# Patient Record
Sex: Female | Born: 1965 | Race: White | Hispanic: No | Marital: Married | Smoking: Never smoker
Health system: Southern US, Community
[De-identification: ages and names within clinical notes are randomized; demographics above are authoritative.]

## PROBLEM LIST (undated history)

## (undated) DIAGNOSIS — N912 Amenorrhea, unspecified: Secondary | ICD-10-CM

## (undated) DIAGNOSIS — N970 Female infertility associated with anovulation: Secondary | ICD-10-CM

## (undated) DIAGNOSIS — N6320 Unspecified lump in the left breast, unspecified quadrant: Secondary | ICD-10-CM

## (undated) DIAGNOSIS — O9989 Other specified diseases and conditions complicating pregnancy, childbirth and the puerperium: Secondary | ICD-10-CM

## (undated) DIAGNOSIS — Z8742 Personal history of other diseases of the female genital tract: Secondary | ICD-10-CM

## (undated) DIAGNOSIS — IMO0002 Reserved for concepts with insufficient information to code with codable children: Secondary | ICD-10-CM

## (undated) DIAGNOSIS — A4902 Methicillin resistant Staphylococcus aureus infection, unspecified site: Secondary | ICD-10-CM

## (undated) DIAGNOSIS — N63 Unspecified lump in unspecified breast: Secondary | ICD-10-CM

## (undated) DIAGNOSIS — Z9889 Other specified postprocedural states: Secondary | ICD-10-CM

## (undated) DIAGNOSIS — Z8619 Personal history of other infectious and parasitic diseases: Secondary | ICD-10-CM

## (undated) HISTORY — DX: Amenorrhea, unspecified: N91.2

## (undated) HISTORY — DX: Other specified postprocedural states: Z98.890

## (undated) HISTORY — DX: Personal history of other diseases of the female genital tract: Z87.42

## (undated) HISTORY — DX: Personal history of other infectious and parasitic diseases: Z86.19

## (undated) HISTORY — DX: Reserved for concepts with insufficient information to code with codable children: IMO0002

## (undated) HISTORY — PX: TUBAL LIGATION: SHX77

## (undated) HISTORY — PX: DILATION AND CURETTAGE OF UTERUS: SHX78

## (undated) HISTORY — DX: Other specified diseases and conditions complicating pregnancy, childbirth and the puerperium: O99.89

## (undated) HISTORY — DX: Female infertility associated with anovulation: N97.0

## (undated) HISTORY — PX: LAPAROSCOPY: SHX197

## (undated) HISTORY — DX: Unspecified lump in unspecified breast: N63.0

---

## 1995-10-26 DIAGNOSIS — IMO0002 Reserved for concepts with insufficient information to code with codable children: Secondary | ICD-10-CM

## 1995-10-26 DIAGNOSIS — R87619 Unspecified abnormal cytological findings in specimens from cervix uteri: Secondary | ICD-10-CM

## 1995-10-26 HISTORY — DX: Reserved for concepts with insufficient information to code with codable children: IMO0002

## 1995-10-26 HISTORY — DX: Unspecified abnormal cytological findings in specimens from cervix uteri: R87.619

## 1998-02-06 ENCOUNTER — Other Ambulatory Visit: Admission: RE | Admit: 1998-02-06 | Discharge: 1998-02-06 | Payer: Self-pay | Admitting: Family Medicine

## 2000-01-12 ENCOUNTER — Other Ambulatory Visit: Admission: RE | Admit: 2000-01-12 | Discharge: 2000-01-12 | Payer: Self-pay | Admitting: Family Medicine

## 2001-04-30 ENCOUNTER — Other Ambulatory Visit: Admission: RE | Admit: 2001-04-30 | Discharge: 2001-04-30 | Payer: Self-pay | Admitting: Family Medicine

## 2001-08-11 ENCOUNTER — Ambulatory Visit (HOSPITAL_COMMUNITY): Admission: RE | Admit: 2001-08-11 | Discharge: 2001-08-11 | Payer: Self-pay | Admitting: Obstetrics and Gynecology

## 2001-08-11 ENCOUNTER — Encounter: Payer: Self-pay | Admitting: Obstetrics and Gynecology

## 2002-04-13 ENCOUNTER — Ambulatory Visit (HOSPITAL_COMMUNITY): Admission: RE | Admit: 2002-04-13 | Discharge: 2002-04-13 | Payer: Self-pay | Admitting: Obstetrics and Gynecology

## 2002-05-03 ENCOUNTER — Other Ambulatory Visit: Admission: RE | Admit: 2002-05-03 | Discharge: 2002-05-03 | Payer: Self-pay | Admitting: Obstetrics and Gynecology

## 2002-05-07 ENCOUNTER — Encounter: Payer: Self-pay | Admitting: Obstetrics and Gynecology

## 2002-05-07 ENCOUNTER — Encounter: Admission: RE | Admit: 2002-05-07 | Discharge: 2002-05-07 | Payer: Self-pay | Admitting: Obstetrics and Gynecology

## 2003-06-12 ENCOUNTER — Other Ambulatory Visit: Admission: RE | Admit: 2003-06-12 | Discharge: 2003-06-12 | Payer: Self-pay | Admitting: Obstetrics and Gynecology

## 2003-08-28 ENCOUNTER — Ambulatory Visit (HOSPITAL_COMMUNITY): Admission: RE | Admit: 2003-08-28 | Discharge: 2003-08-28 | Payer: Self-pay | Admitting: Obstetrics and Gynecology

## 2003-12-11 ENCOUNTER — Inpatient Hospital Stay (HOSPITAL_COMMUNITY): Admission: AD | Admit: 2003-12-11 | Discharge: 2003-12-11 | Payer: Self-pay | Admitting: Obstetrics and Gynecology

## 2004-01-06 ENCOUNTER — Inpatient Hospital Stay (HOSPITAL_COMMUNITY): Admission: AD | Admit: 2004-01-06 | Discharge: 2004-01-06 | Payer: Self-pay | Admitting: Obstetrics and Gynecology

## 2004-01-12 ENCOUNTER — Inpatient Hospital Stay (HOSPITAL_COMMUNITY): Admission: AD | Admit: 2004-01-12 | Discharge: 2004-01-18 | Payer: Self-pay | Admitting: Obstetrics and Gynecology

## 2004-01-13 ENCOUNTER — Encounter (INDEPENDENT_AMBULATORY_CARE_PROVIDER_SITE_OTHER): Payer: Self-pay | Admitting: Specialist

## 2004-01-14 DIAGNOSIS — O99891 Other specified diseases and conditions complicating pregnancy: Secondary | ICD-10-CM

## 2004-01-14 DIAGNOSIS — Z9889 Other specified postprocedural states: Secondary | ICD-10-CM

## 2004-01-14 HISTORY — DX: Other specified diseases and conditions complicating pregnancy: O99.891

## 2004-01-14 HISTORY — DX: Other specified postprocedural states: Z98.890

## 2004-01-19 ENCOUNTER — Encounter: Admission: RE | Admit: 2004-01-19 | Discharge: 2004-02-18 | Payer: Self-pay | Admitting: Obstetrics and Gynecology

## 2004-06-08 ENCOUNTER — Other Ambulatory Visit: Admission: RE | Admit: 2004-06-08 | Discharge: 2004-06-08 | Payer: Self-pay | Admitting: Obstetrics and Gynecology

## 2005-06-11 ENCOUNTER — Other Ambulatory Visit: Admission: RE | Admit: 2005-06-11 | Discharge: 2005-06-11 | Payer: Self-pay | Admitting: Obstetrics and Gynecology

## 2006-08-10 ENCOUNTER — Encounter (INDEPENDENT_AMBULATORY_CARE_PROVIDER_SITE_OTHER): Payer: Self-pay | Admitting: *Deleted

## 2006-08-10 ENCOUNTER — Inpatient Hospital Stay (HOSPITAL_COMMUNITY): Admission: RE | Admit: 2006-08-10 | Discharge: 2006-08-14 | Payer: Self-pay | Admitting: Obstetrics and Gynecology

## 2007-11-02 ENCOUNTER — Encounter: Admission: RE | Admit: 2007-11-02 | Discharge: 2007-11-02 | Payer: Self-pay | Admitting: Obstetrics and Gynecology

## 2009-02-10 ENCOUNTER — Encounter: Admission: RE | Admit: 2009-02-10 | Discharge: 2009-02-10 | Payer: Self-pay | Admitting: Obstetrics and Gynecology

## 2010-04-24 ENCOUNTER — Encounter: Admission: RE | Admit: 2010-04-24 | Discharge: 2010-04-24 | Payer: Self-pay | Admitting: Obstetrics and Gynecology

## 2010-10-11 ENCOUNTER — Emergency Department (HOSPITAL_BASED_OUTPATIENT_CLINIC_OR_DEPARTMENT_OTHER)
Admission: EM | Admit: 2010-10-11 | Discharge: 2010-10-11 | Payer: Self-pay | Source: Home / Self Care | Admitting: Emergency Medicine

## 2011-03-12 NOTE — Op Note (Signed)
NAMETRENT, THEISEN                         ACCOUNT NO.:  [id number]   MEDICAL RECORD NO.:  [id number]                   PATIENT TYPE:  INP   LOCATION:  9371                                 FACILITY:  [facility]   PHYSICIAN:  Crist Fat. Rivard, M.D.              DATE OF BIRTH:  09-09-1966   DATE OF PROCEDURE:  01/14/2004  DATE OF DISCHARGE:                                 OPERATIVE REPORT   PREOPERATIVE DIAGNOSIS:  Postpartum hemorrhage, rule out placental  retention.   POSTOPERATIVE DIAGNOSES:  1. Postpartum hemorrhage.  2. Uterine atony.  3. Vaginal laceration.   ANESTHESIA:  Epidural.   PROCEDURES:  1. Dilatation and curettage.  2. Suture of right deep vaginal laceration.  3. Methergine series.  4. Cytotec per rectum.  5. Transfusion of two units.   SURGEON:  Crist Fat. Rivard, M.D.   ASSISTANTRenaldo Reel. Emilee Hero, C.N.M.   PROCEDURE:  After being informed of the planned procedure, namely D&C,  because of refractory postpartum bleeding, and after reviewing risks and  benefits including bleeding, infection, possible need for hysterectomy if  bleeding does not subside and also need for transfusion with risks and  benefits, Informed consent was procured from the patient.  The patient is  taken to OR #3 and pre-existing epidural is reinforced.  She is placed in a  lithotomy position, prepped and draped in a sterile fashion, and a Foley  catheter is in her bladder.  Exam reveals a constant bleeding from the  uterus with clotting not responding to bimanual uterine massage, as well as  bleeding from the right deep vaginal laceration.  Pitocin was augmented to  40 units/1000 mL and the patient received two doses of Methergine as we were  repairing her vaginal laceration with 3-0 Vicryl.  Unable to maintain her  blood pressure with readings of diastolic varying between 26 and 35 and  systolic between 80 and 90, decision was made to proceed with transfusion of  two units.  We also used  Cytotec 1000 mcg per rectum.  We then performed a  curettage using a sharp curette.  The uterine cavity, which removed small  amount of endometrium, no evidence of placental retention.  Aggressive  bimanual massage associated with Methergine and Cytotec led to a strong  uterine contraction and reduction in blood flow.  Compression on the right  vaginal laceration also decreased bleeding from that site.  Lab work drawn  during the procedure revealed a platelet count dropped to 95,000 with a  preop platelet count of 254, and a hemoglobin revealed 3.6, which was  questionable result since the blood had been drawn from the left arm after  stopping the IV perfusion for a few minutes.  Regardless, transfusions were  given to the patient, which rapidly corrected her blood pressure and brought  it back to 120-130/80 and 90.  We observed the patient while waiting for a  DIC panel for about 15 minutes, and the patient remained with good uterine  contraction, normal vaginal bleeding, and there was no more oozing from her  right vaginal laceration.   Instrument and sponge count was complete x2.  The overall blood loss  including postpartum blood loss and perioperative blood loss equals 2000 mL.  The procedure is well-tolerated by the patient, who is taken to the recovery  room in a well and stable condition.  The patient will then be taken to [facility]  for close observation for the next 12 hours.                                               Crist Fat Rivard, M.D.    SAR/MEDQ  D:  01/14/2004  T:  01/15/2004  Job:  [id number]

## 2011-03-12 NOTE — Op Note (Signed)
[name], [name]               ACCOUNT NO.:  [id number]   MEDICAL RECORD NO.:  [id number]          PATIENT TYPE:  INP   LOCATION:  9105                          FACILITY:  [facility]   PHYSICIAN:  Hal Morales, M.D.DATE OF BIRTH:  1965-11-29   DATE OF PROCEDURE:  DATE OF DISCHARGE:                                 OPERATIVE REPORT   PREOPERATIVE DIAGNOSES:  1. Intrauterine pregnancy at term.  2. History of traumatic vaginal delivery with last pregnancy, followed by      postpartum hemorrhage requiring transfusion.  3. History of disseminated intravascular coagulation (DIC).  4. Desire for surgical sterilization.   POSTOPERATIVE DIAGNOSES:  1. Intrauterine pregnancy at term.  2. History of traumatic vaginal delivery with last pregnancy, followed by      postpartum hemorrhage requiring transfusion.  3. History of disseminated intravascular coagulation (DIC).  4. Desire for surgical sterilization.   OPERATION:  Primary low transverse cesarean section, bilateral tubal  ligation.   SURGEON:  Hal Morales, M.D.   FIRST ASSISTANT:  Rhona Leavens, CNM.   ANESTHESIA:  Spinal.   ESTIMATED BLOOD LOSS:  750 mL.   COMPLICATIONS:  None.   FINDINGS:  The patient was delivered of a female infant whose name is  [name] weighing 7 pounds 10 ounces.  The uterus, tubes and ovaries  appeared normal for the gravid state except for a right paratubal cyst  measuring approximately 3 cm.   PROCEDURE:  The patient was taken to the operating room after appropriate  identification and placed on the operating table.  After the placement of a  spinal anesthetic, she was placed in the supine position with a left lateral  tilt.  The abdomen and perineum were prepped with multiple layers of  Betadine.  A Foley catheter was inserted into the bladder and connected to  straight drainage.  The abdomen was draped as a sterile field.  Once  adequate anesthesia had been documented, the suprapubic  region was  infiltrated with 20 mL of 0.25% Marcaine.  A suprapubic incision was made  and the abdomen opened in layers.  The peritoneum was entered and the  bladder blade placed.  The uterus was incised approximately 2 cm above the  uterovesical fold and that incision taken laterally on either side bluntly.  The infant was delivered from the occiput transverse position, and after  having the nares and pharynx suctioned and the cord clamped and cut, was  handed off to the awaiting pediatrician after noting that the infant had  voided.  The appropriate cord blood was drawn and the placenta allowed to  separate from the uterus and was removed from the operative field.  The  uterine cavity was cleaned with a moist laparotomy pad.  Uterine incision  was closed with a running interlocking suture of 0-Vicryl.  An imbricating  suture of 0-Vicryl was placed.  Several figure-of-eight sutures of 0-Vicryl  were required for adequate hemostasis.  Copious irrigation was carried out.  The left fallopian tube was identified, followed to its fimbriated end, then  grasped at the isthmic portion and elevated.  A suture of 2-0 chromic was  placed through the mesosalpinx and tied fore and aft on a knuckle of tube.  A second ligature was placed proximal to that and the intervening knuckle of  tube excised.  The cut ends were cauterized.  A similar procedure was  carried out on the right side and the portions of tube from the right and  left were removed from the operative field.  The right paratubal cyst was  then excised with the aid of cautery from the medial salpinx and removed  from the operative field intact.  The hemostasis again was noted to be  adequate.  The abdominal peritoneum was then closed with a running suture of  2-0 Vicryl.  The rectus muscles were reapproximated with a figure-of-eight  suture of 2-0 Vicryl.  The rectus fascia was closed with a running suture of  0-Vicryl and reinforced on  either side of midline with figure-of-eight  sutures of 0-Vicryl.  The subcutaneous tissue was made hemostatic with Bovie  cautery and irrigated.  Skin staples were applied to the skin incision.  A  sterile dressing was applied.  The patient was taken from the operating room  to the recovery room in satisfactory condition having tolerated the  procedure well with sponge and instrument counts correct.  The infant went  to the full-term nursery.  The placenta was sent to pathology.  Apgars were  9 and 9 at 1 and 5 minutes respectively.      Hal Morales, M.D.  Electronically Signed     VPH/MEDQ  D:  08/10/2006  T:  08/11/2006  Job:  [id number]

## 2011-03-12 NOTE — Op Note (Signed)
[facility]  Patient:    [name], [name] Visit Number: [id number] MRN: [id number]          Service Type: DSU Location: [facility] Attending Physician:  Shaune Spittle Dictated by:   Maris Berger. Pennie Rushing, M.D. Proc. Date: 04/13/02 Admit Date:  04/13/2002 Discharge Date: 04/13/2002                             Operative Report  PREOPERATIVE DIAGNOSIS:       Primary infertility.  POSTOPERATIVE DIAGNOSIS:      Primary infertility.  OPERATION:                    Diagnostic laparoscopy.  SURGEON:                      Vanessa P. Pennie Rushing, M.D.  ANESTHESIA:                   General orotracheal.  ESTIMATED BLOOD LOSS:         Less than 10 cc.  COMPLICATIONS:                None.  FINDINGS:                     The uterus and the left tube appeared completely normal.  The right tube appeared normal except for a paratubal cyst at the fimbriated end.  The right ovary appeared normal.  The left ovary contained what was an apparent follicular cyst.  The appendix and liver edge and the gallbladder all appeared normal.  DESCRIPTION OF PROCEDURE:     The patient was taken to the operating room after appropriate identification and placed on the operating table.  After obtaining adequate general anesthesia, she was placed in the modified lithotomy position.  The abdomen, perineum, and vagina were prepped with multiple layers of Betadine, and a Foley catheter placed in the bladder under sterile conditions, and connected to straight drainage.  A single tooth tenaculum was placed on the anterior cervix.  The abdomen was draped as a sterile field.  Subumbilical injection of 0.25% Marcaine was undertaken as was suprapubic injection on the right and left side for a total of 10 cc.  A subumbilical incision was made and Veress cannula placed through that incision into the peritoneal cavity.  A pneumoperitoneum was created with 2.5 L of CO2. The Veress cannula was  removed and the laparoscopic trocar placed through the incision into the peritoneal cavity.  The laparoscope was placed through the trocar sleeve.  Suprapubic incision was made to the left of the midline and a laparoscope probe trocar placed through that incision into the peritoneal cavity.  The above noted findings were made and documented.  Specifically, no stigmata of endometriosis or significant adhesions were noted.  All instruments were then removed from the peritoneal cavity under direct visualization as the CO2 was allowed to escape.  The suprapubic and subumbilical incisions were closed with subcuticular sutures of 3-0 Vicryl. Sterile dressings were applied.  The single tooth tenaculum and Foley catheter were removed, and the patient was awakened from general anesthesia, and taken to the recovery room in satisfactory condition having tolerated the procedure well with sponge and instrument counts correct. Dictated by:   Maris Berger. Pennie Rushing, M.D. Attending Physician:  Shaune Spittle DD:  04/13/02 TD:  04/16/02 Job: [id number]

## 2011-03-12 NOTE — Discharge Summary (Signed)
NAMEJANEICE, STEGALL               ACCOUNT NO.:  [id number]   MEDICAL RECORD NO.:  [id number]          PATIENT TYPE:  INP   LOCATION:  9115                          FACILITY:  [facility]   PHYSICIAN:  Crist Fat. Rivard, M.D. DATE OF BIRTH:  09-11-1966   DATE OF ADMISSION:  08/10/2006  DATE OF DISCHARGE:  08/14/2006                                 DISCHARGE SUMMARY   ADMISSION DIAGNOSES:  1. Term pregnancy.  2. History of traumatic vaginal delivery with postpartum hemorrhage      requiring transfusion.  3. Desires primary low transverse cesarean section.  4. Desires sterilization.  5. Possible Down syndrome infant.   DISCHARGE DIAGNOSES:  1. Term pregnancy.  2. History of traumatic vaginal delivery with postpartum hemorrhage      requiring transfusion.  3. Desires primary low transverse cesarean section.  4. Desires sterilization.  5. Possible Down syndrome infant.   PROCEDURE:  1. Primary low transverse cesarean section.  2. Spinal anesthesia.   HOSPITAL COURSE:  Ms. Bugh is a 45 year old gravida 2 para 1-0-0-1 who was  admitted at 36-6/7ths week on 08/10/2006 for elective primary cesarean  section.  The patient had requested primary elective cesarean sections with  her first delivery of significant postpartum hemorrhage requiring D&C, blood  transfusions and development of DIC.   Pregnancy has been remarkable for:  1. Advanced maternal age.  2. History of the patient's first child with Down's syndrome,  3. History of postpartum hemorrhage.  4. History of infertility.  5. History of pregnancy induced hypertension.  6. History of a questionable short cervix.  7. Abnormal ultrasound findings indicating possible Down syndrome at 35      weeks with the patient declining all genetic testing.  8. Positive group B Strep.   The patient was taken to the operating room where a primary low transverse  cesarean section was performed by Dr. Pennie Rushing under spinal anesthesia.  Findings  were a viable female by the name of Gardiner Barefoot weight 7 pounds 10  ounces.  Apgars were 9 and 9.  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  This did require the  infant being admitted to the NICU and surgery to be performed on the first  day of life.  The patient and her husband felt that they had all resources  available to them, given their previous experience with having a child with  Down syndrome and were coping very well with this diagnosis.   The patient's physical examination  on day #1 was within normal limits.  Her  hemoglobin was 8.1 down from 10.5.  White blood cell count 9.9, platelets  276.  Her bleeding was a small amount.  She was up ad lib without any  difficulty.  She was continuing on iron.  On postop day #2 the patient was  continuing to do well.  Her infant was doing well post surgery with an  anticipated 7-to-10-day stay.  Staples had been placed and the patient was  tolerating pain without any difficulty.  Down syndrome support group  member  did come visit the patient.  The rest of the patient's stay was essentially  uncomplicated.  She had conferred with her insurance company and was able to  stay a total of 4 days in light of the infant's status.  By postop day #4  the patient was doing well, was up at lib, tolerating a regular diet, having  good control with pain with pain medication.  Her infant was stable in the  NICU, off the ventilator and on nasal cannula O2.  The patient was  continuing to bottle feed.  Her vital signs were stable and her incision was  clean, try and intact.  Staples were removed without difficulty and Steri-  Strips were applied.  The patient was deemed to received full benefit of her  hospital stay and was discharged home.   DISCHARGE INSTRUCTIONS:  Presented a [facility] handout.   DISCHARGE MEDICATIONS:  1. Motrin 600 mg p.o. q.6 h. p.r.n. pain.  2. Percocet 1-2 p.o. q.3-4  h. p.r.n. pain.  3. Iron supplement 1 p.o. daily.   DISCHARGE FOLLOWUP:  Will occur at [facility] or p.r.n.   Support to the patient was offered for her infant's __________.  The patient  and her family seem to be coping very well and have resources available to  them.      Renaldo Reel Emilee Hero, C.N.M.      Crist Fat Rivard, M.D.  Electronically Signed    VLL/MEDQ  D:  08/14/2006  T:  08/15/2006  Job:  [id number]

## 2011-03-12 NOTE — Discharge Summary (Signed)
[name], [name]                         ACCOUNT NO.:  [id number]   MEDICAL RECORD NO.:  [id number]                   PATIENT TYPE:  INP   LOCATION:  9312                                 FACILITY:  [facility]   PHYSICIAN:  Cam Hai, C.N.M.               DATE OF BIRTH:  03/09/1966   DATE OF ADMISSION:  01/12/2004  DATE OF DISCHARGE:  01/18/2004                                 DISCHARGE SUMMARY   ADMISSION DIAGNOSES:  1. Intrauterine pregnancy at term.  2. Post dates.  3. Gestational hypertension.   DISCHARGE DIAGNOSES:  1. Intrauterine pregnancy at term.  2. Post dates.  3. Gestational hypertension.  4. Postpartum hemorrhage.   PROCEDURE:  1. Dilatation and curettage.  2. Suture of right deep vaginal laceration.  3. Methergine series.  4. Cytotec per rectum.  5. Transfusion of two units of packed cells.   HOSPITAL COURSE:  Ms. Styles is a 45 year old married white female, gravida  1, para 0, who was admitted at [redacted] weeks gestation for induction of labor  secondary to post dates and for gestational hypertension during this  pregnancy. Her pregnancy has been followed at [facility] by the  MD service and at risk for: (1) advanced maternal age, declining an  amniocentesis; (2) contraception by intrauterine insemination; (3) shortened  cervix. She also has gestational hypertension that developed at around 35  weeks and was started on Aldomet for that which maintained her blood  pressures in the normal range. On the morning of January 13, 2004, the patient  was started on Pitocin for induction. Her blood pressures were well  controlled and there was no evidence of superimposed preeclampsia. Her  cervix at that was 3+ cm, 75% effaced, vertex, -2, and her membranes were  ruptured for moderate stained meconium fluid. The patient continued to  progress in labor during the day and delivered that evening at 10 p.m. The  infant's name was Venia Minks. His Apgars were 8 at  1 minute and 8 at 5  minutes, and he was evaluated by the pediatric team secondary to the  meconium fluid. The patient had a right vaginal laceration that was repaired  as well as a second-degree perineal laceration repaired as well. Her  bleeding continued heavily requiring a manual extraction of the placenta.  The patient began to be symptomatic with some blood loss which at that point  was approximately 1000 cc. She was dizzy with nausea and a blood pressure of  80/40. She improved with fluid replacement and her blood pressure increased  to within normal range. Later that evening she continued to be symptomatic  and the bleeding remained significant. Her hemoglobin was then 8.6 and had  been 13.8 prior to delivery. The patient was taken to the operating room to  rule out placental retention. At that point it was also reviewed with the  patient the possibility of the baby having  Down syndrome. While in the  operating room the patient received Methergine series as well as Cytotec per  rectum as well as two units of packed cells. Her total blood loss from post  delivery and during her surgery was 2000 cc.  By postpartum day one the  patient was sitting up without dizziness. Her blood pressure and other vital  signs were in the normal range. Her pulse was slightly tachycardic.  Her  hemoglobin that morning was 9.9 and her platelets were 103,000. Her clotting  studies were abnormal. By postoperative day #2, she complained of feeling  dizzy with standing, but no shortness of breath or chest pain. The infant at  that point was in the NICU and IV antibiotics to rule out sepsis. The  patient's blood pressures were normal. Her pulses were still slightly  tachycardic.  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 She was offered another  transfusion which she declined. Her labs at this point were improving. By  postoperative day #3,  her hemoglobin was 5.9, vital signs were stable. Later  in the afternoon on postoperative day #3, Dr. Pennie Rushing reviewed options of  another transfusion. The patient declined due to the fact that she did not  have any orthostatic vital sign changes and her anemia was only mildly  symptomatic. She was transferred to the mother/baby unit. By postoperative  day #4, she was doing  much better. She was able to shower and ambulate  without syncope, but stated that she continued to get tired with activity.  Her vital signs were stable.  Hemoglobin was 5.8.  Infant remained in the  NICU. By postoperative day #5, she was deemed to have received the full  benefit of her hospital stay, she was feeling much better, vital signs  remained stable, and she was discharged home.   DISCHARGE MEDICATIONS:  1. Motrin 600 mg one p.o. q.6h. p.r.n. pain.  2. Prenatal vitamins one p.o. daily.  3. Iron sulfate one p.o. b.i.d.   DISCHARGE INSTRUCTIONS:  Per [facility] handout.   DISCHARGE FOLLOWUP:  At [facility] in four to six weeks  postpartum or p.r.n.                                               Cam Hai, C.N.M.    KS/MEDQ  D:  01/18/2004  T:  01/18/2004  Job:  [id number]
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
## 2011-03-12 NOTE — H&P (Signed)
NAMEALUEL, SCHWARZ               ACCOUNT NO.:  [id number]   MEDICAL RECORD NO.:  [id number]          PATIENT TYPE:  AMB   LOCATION:  SDC                           FACILITY:  [facility]   PHYSICIAN:  Hal Morales, M.D.DATE OF BIRTH:  April 16, 1966   DATE OF ADMISSION:  DATE OF DISCHARGE:                                HISTORY & PHYSICAL   [name] is a 45 year old gravida 2, para 1-0-0-1 who was admitted at 68  and 6/7 weeks' gestation for elective primary cesarean section.  The patient  reports normal fetal movement.  The patient reports occasional uterine  contractions, however, they are irregular; no leakage of fluid or bleeding.  The patient reports that her fetus has been moving normally.  The patient  requested primary elective cesarean section due to previous history with her  first delivery of significant postpartum hemorrhage requiring D&C, blood  transfusions, and development of DIC.  The patient's pregnancy remarkable  for:   1. Advanced maternal age.  2. A history of the patient's first child with Down's syndrome.  3. A history of postpartum hemorrhage.  4. 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  5. A history of pregnancy-induced hypertension.  6. A history of a questionable short cervix.  7. Abnormal ultrasound findings at 35 weeks.  The patient declined all      genetic testing.  8. Positive group B strep.   PRENATAL LABS:  Initial hemoglobin 13.8, hematocrit 41.7, platelets 309,000,  blood type O positive, antibody screen negative, RPR nonreactive, Rubella  titer immune, hepatitis B surface antigen negative, HIV declined, Gonorrhea  and Chlamydia declined, Pap smear August 2006 within normal limits.  Cystic  fibrosis testing negative.  The patient declined first trimester screen as  well as quad screen and amniocentesis, a 27-week hemoglobin 11.4, Glucola at  27 weeks 117, group B strep is positive, third trimester cultures were  declined.   HISTORY OF PRESENT  PREGNANCY:  The patient entered care at 55 weeks'  gestation.  The patient's pregnancy has been followed by the MD service at  [facility].  The patient's last menstrual period November 11, 2005 with [facility] of August 18, 2006 confirmed with ultrasound of 11 weeks and  4 days.  The patient at her initial OB exam expressed a desire to discuss  elective primary cesarean section due to the previous history of postpartum  hemorrhage.  At 15 weeks, the patient was treated with Terazol-7 for vaginal  yeast.  Cervix questionably shortened on exam at that time.  Ultrasound at  that time revealed cervical length of 2.74 cm with no funneling or dilation  noted.  The patient at 15 weeks had again continued to decline  all genetic  testing.  At 20 weeks, the patient underwent repeat ultrasound which  revealed a normal anatomy and cervical length of 3.94 cm.  The patient with  complaints of heartburn at that time which was unrelieved with Tums and was  given a prescription for Protonix.  However, the patient did not initiate  Protonix.  The patient reports that the heartburn  symptoms resolved  spontaneously.  The patient seeing a chiropractor for hip pain at 33 weeks'  gestation.  At 35 weeks,  the patient underwent ultrasound due to size less  than dates.  At that time, estimated fetal weight 6 pounds 15 ounces at the  88th percentile.  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  Findings were discussed with the patient in regards to increase of  likelihood of Down's syndrome and the patient declines any testing at that  time as well.  Group B strep was obtained at 36 weeks and returned with  positive results.  The patient's blood pressure at 36 weeks noted to  be  160/80 with a repeat of 130/70.  The patient also had been following her  blood pressures at home and reports that it runs  120s/80s.  Again, a desire  for C-section was discussed.  The risks, benefits, and alternatives of C-  section were discussed with the patient.  At 36 weeks, the patient indicated  she wanted to labor if she goes into spontaneous labor prior to a scheduled  C-section.  However, if she does not go into spontaneous labor prior to that  date, then patient elects to proceed with primary C-section.  Again,  abnormal ultrasound discussed with the patient at 36 weeks and patient  continued to decline chromosome studies.  Blood pressure at 37 weeks 136/94  with a repeat of 126/84.  The patient with  no PIH signs and symptoms.   OB HISTORY:  Pregnancy #1 in March 2005 spontaneous vaginal delivery female  infant 8 pounds 0 ounces.  The patient with postpartum uterine atony and  postpartum hemorrhage as well as a deep right vaginal laceration.  Postpartum hemorrhage required  D&C as well as blood transfusion.  The  patient's labor induced at that time due to pregnancy-induced hypertension  as well as post dates.  Pregnancy #1 also conceived with IUI.  Pregnancy #2  is present.   GYN HISTORY:  Last contraceptive use approximately 7 years ago.  The patient  reports a history of abnormal Pap x1 which was merely repeated and all Paps  since then have been within normal limits.  The patient denies a history of  STDs.  The patient  with a questionable curved cervix.  The patient with a  history of infertility as noted above.  The patient with a history of  laparoscopy in 2003 to evaluate infertility as well as HSG.  The patient  also with D&C postpartum.   MEDICAL HISTORY:  1. The patient with a history of postpartum anemia only.  2. The patient with a history of pregnancy-induced hypertension but no      chronic hypertension. 3. The patient with a history of frequent cystitis  as a child.   PAST SURGICAL HISTORY:  1. Laparoscopy, 2003.  2. HSG, 2003.  3. Urethral dilation as a child.  4. D&C, 2005.   ALLERGIES:  No known drug allergies.   CURRENT MEDICATIONS:  Prenatal vitamins.   FAMILY HISTORY:  Paternal grandmother coronary artery disease, diabetes.  Paternal grandfather coronary artery disease.  Mother hypertension.  Father  hypertension.  Maternal grandmother phlebitis, diabetes, Alzheimer's.  Paternal first cousin schizophrenia.  Maternal aunt breast cancer and first  cousin x2  breast cancer.   GENETIC HISTORY:  The patient 45 years-old.  The patient's son, Andrey Campanile, with  Down's syndrome, otherwise negative.   SOCIAL HISTORY:  The patient is married to the father of the baby, Sam, who  is involved and supportive.  The patient denies use of tobacco, alcohol, or  street drugs, and the patient's domestic violence screen is negative.  The  patient and her husband are of [place] faith.  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  Father of the baby with 18 years of education  and is a Chemical engineer.   REVIEW OF SYSTEMS:  Typical of one at term pregnancy.   PHYSICAL EXAMINATION:  VITAL SIGNS:  The patient is afebrile.  Vital signs  are stable; blood pressure 126/84 at office visit.  HEENT: Within normal limits.  LUNGS:  Clear.  HEART: Regular rate and rhythm.  BREASTS:  Soft.  ABDOMEN:  Soft, gravid, and nontender.  Fetus noted to be in longitudinal  lie and vertex to Leopold's maneuvers.  Fetal heart rate in the 140s.  Digital exam of the cervix at last office visit 2 cm, 80% effaced, -2  station.  EXTREMITIES: With no edema and negative Homan's bilaterally.   ASSESSMENT:  1. Intrauterine pregnancy at 35 and 6/7 weeks' gestation.  2. Desires elective primary cesarean section.  3. A history of postpartum hemorrhage requiring transfusion and      disseminated intravascular coagulation.   PLAN:  1. The patient  will be admitted to [facility] in [place] for the      same day surgery per Dr. Dierdre Forth.  2. Routine M.D. preoperative orders.  3. The risks, benefits, and alternatives of C-section has been discussed      with the patient by Dr. Pennie Rushing and patient desires to proceed.      Rhona Leavens, CNM      Hal Morales, M.D.  Electronically Signed    NOS/MEDQ  D:  08/08/2006  T:  08/08/2006  Job:  [id number]

## 2012-04-24 ENCOUNTER — Other Ambulatory Visit: Payer: Self-pay | Admitting: Obstetrics and Gynecology

## 2012-04-24 DIAGNOSIS — Z1231 Encounter for screening mammogram for malignant neoplasm of breast: Secondary | ICD-10-CM

## 2012-05-03 ENCOUNTER — Telehealth: Payer: Self-pay | Admitting: Obstetrics and Gynecology

## 2012-05-03 NOTE — Telephone Encounter (Signed)
Tc to pt's husband rgdg telephone call. Pt's husband is "very concerned about pt's short term memory loss over the past 2 months". Pt has a physical sched at the end of the month for a complete workup with a primary care physician([facility]).  Pt's husband wants Dr. Pennie Rushing to know this rgdg pt prior to her appt on 05/04/12 that is scheduled with Dr. Pennie Rushing for annual. Informed caller will make Dr.Haygood aware. Caller agrees.

## 2012-05-04 ENCOUNTER — Ambulatory Visit: Payer: Self-pay

## 2012-05-04 ENCOUNTER — Ambulatory Visit
Admission: RE | Admit: 2012-05-04 | Discharge: 2012-05-04 | Disposition: A | Discharge status: Home / Self Care | Payer: Self-pay | Source: Ambulatory Visit | Attending: Obstetrics and Gynecology | Admitting: Obstetrics and Gynecology

## 2012-05-04 ENCOUNTER — Ambulatory Visit (INDEPENDENT_AMBULATORY_CARE_PROVIDER_SITE_OTHER): Payer: BC Managed Care – PPO | Admitting: Obstetrics and Gynecology

## 2012-05-04 ENCOUNTER — Encounter: Payer: Self-pay | Admitting: Obstetrics and Gynecology

## 2012-05-04 VITALS — BP 122/62 | Ht 59.0 in | Wt 113.0 lb

## 2012-05-04 DIAGNOSIS — Z1231 Encounter for screening mammogram for malignant neoplasm of breast: Secondary | ICD-10-CM

## 2012-05-04 DIAGNOSIS — Z01419 Encounter for gynecological examination (general) (routine) without abnormal findings: Secondary | ICD-10-CM | POA: Insufficient documentation

## 2012-05-04 NOTE — Progress Notes (Signed)
Last Pap: 04/22/2010 WNL: Yes Regular Periods:yes  Contraception: BTL  Monthly Breast exam:yes Tetanus<76yrs:yes Nl.Bladder Function:yes Daily BMs:yes Healthy Diet:yes Calcium:no Mammogram:yes Date of Mammogram: 04/24/2010 Apt for that today  Exercise:yes Have often Exercise: 3 times weekly Seatbelt: yes Abuse at home: no Stressful work:yes Sigmoid-colonoscopy: N/A Bone Density: No PCP: [facility]  Change in PMH: NONE Change in [facility] NONE Subjective:    [name] is a 46 y.o. female, G2P2, who presents for an annual exam. Patient's husband called with concerns about her memory however the patient does not complain of any of these concerns    History   Social History  . Marital Status: Married    Spouse Name: N/A    Number of Children: N/A  . Years of Education: N/A   Social History Main Topics  . Smoking status: Never Smoker   . Smokeless tobacco: Never Used  . Alcohol Use: No  . Drug Use: No  . Sexually Active: Yes    Birth Control/ Protection: Surgical     BTL   Other Topics Concern  . None   Social History Narrative  . None    Menstrual cycle:   LMP: Patient's last menstrual period was 04/26/2012.           Cycle: regular.  No longer has menstrual cramps  The following portions of the patient's history were reviewed and updated as appropriate: allergies, current medications, past family history, past medical history, past social history, past surgical history and problem list.  Review of Systems Pertinent items are noted in HPI. Breast:Negative for breast lump,nipple discharge or nipple retraction Gastrointestinal: Negative for abdominal pain, change in bowel habits or rectal bleeding Urinary:negative   Objective:    BP 122/62  Ht 4\' 11"  (1.499 m)  Wt 113 lb (51.256 kg)  BMI 22.82 kg/m2  LMP 04/26/2012    Weight:  Wt Readings from Last 1 Encounters:  05/04/12 113 lb (51.256 kg)          BMI: Body mass index is 22.82  kg/(m^2).  General Appearance: Alert, appropriate appearance for age. No acute distress HEENT: Grossly normal Neck / Thyroid: Supple, no masses, nodes or enlargement Lungs: clear to auscultation bilaterally Back: No CVA tenderness Breast Exam: No masses or nodes.No dimpling, nipple retraction or discharge. Cardiovascular: Regular rate and rhythm. S1, S2, no murmur Gastrointestinal: Soft, non-tender, no masses or organomegaly Pelvic Exam: Vulva and vagina appear normal. Bimanual exam reveals normal uterus and adnexa. Rectovaginal: normal rectal, no masses Lymphatic Exam: Non-palpable nodes in neck, clavicular, axillary, or inguinal regions Skin: no rash or abnormalities Neurologic: Normal gait and speech, no tremor  Psychiatric: Alert and oriented, appropriate affect.   Wet Prep:not applicable Urinalysis:not applicable UPT: Not done   Assessment:    Normal gyn exam improved dysmenorrhea  History of 2 sons with trisomy 03-31-23, one of whom died in the neonatal period   Plan:    mammogram return annually or prn STD screening: declined Contraception:bilateral tubal ligation Discuss any concerns about memory with primary care physician with whom she has an appointment in one week      [facility] PMD

## 2012-05-09 ENCOUNTER — Other Ambulatory Visit: Payer: Self-pay | Admitting: Obstetrics and Gynecology

## 2012-05-09 DIAGNOSIS — R928 Other abnormal and inconclusive findings on diagnostic imaging of breast: Secondary | ICD-10-CM

## 2012-05-15 ENCOUNTER — Ambulatory Visit
Admission: RE | Admit: 2012-05-15 | Discharge: 2012-05-15 | Disposition: A | Discharge status: Home / Self Care | Payer: BC Managed Care – PPO | Source: Ambulatory Visit | Attending: Obstetrics and Gynecology | Admitting: Obstetrics and Gynecology

## 2012-05-15 DIAGNOSIS — R928 Other abnormal and inconclusive findings on diagnostic imaging of breast: Secondary | ICD-10-CM

## 2012-05-18 ENCOUNTER — Encounter: Payer: Self-pay | Admitting: Obstetrics and Gynecology

## 2014-05-29 ENCOUNTER — Other Ambulatory Visit: Payer: Self-pay

## 2014-05-29 DIAGNOSIS — Z1231 Encounter for screening mammogram for malignant neoplasm of breast: Secondary | ICD-10-CM

## 2014-06-06 ENCOUNTER — Ambulatory Visit: Payer: BC Managed Care – PPO

## 2014-06-11 ENCOUNTER — Ambulatory Visit
Admission: RE | Admit: 2014-06-11 | Discharge: 2014-06-11 | Disposition: A | Discharge status: Home / Self Care | Payer: BC Managed Care – PPO | Source: Ambulatory Visit

## 2014-06-11 ENCOUNTER — Encounter (INDEPENDENT_AMBULATORY_CARE_PROVIDER_SITE_OTHER): Payer: Self-pay

## 2014-06-11 DIAGNOSIS — Z1231 Encounter for screening mammogram for malignant neoplasm of breast: Secondary | ICD-10-CM

## 2014-08-26 ENCOUNTER — Encounter: Payer: Self-pay | Admitting: Obstetrics and Gynecology

## 2017-04-20 ENCOUNTER — Other Ambulatory Visit: Payer: Self-pay | Admitting: Obstetrics and Gynecology

## 2017-04-20 DIAGNOSIS — Z1231 Encounter for screening mammogram for malignant neoplasm of breast: Secondary | ICD-10-CM

## 2017-04-26 ENCOUNTER — Ambulatory Visit
Admission: RE | Admit: 2017-04-26 | Discharge: 2017-04-26 | Disposition: A | Discharge status: Home / Self Care | Payer: BC Managed Care – PPO | Source: Ambulatory Visit | Attending: Obstetrics and Gynecology | Admitting: Obstetrics and Gynecology

## 2017-04-26 DIAGNOSIS — Z1231 Encounter for screening mammogram for malignant neoplasm of breast: Secondary | ICD-10-CM

## 2017-04-28 ENCOUNTER — Other Ambulatory Visit: Payer: Self-pay | Admitting: Obstetrics and Gynecology

## 2017-04-28 DIAGNOSIS — R928 Other abnormal and inconclusive findings on diagnostic imaging of breast: Secondary | ICD-10-CM

## 2017-05-04 ENCOUNTER — Ambulatory Visit
Admission: RE | Admit: 2017-05-04 | Discharge: 2017-05-04 | Disposition: A | Discharge status: Home / Self Care | Payer: BC Managed Care – PPO | Source: Ambulatory Visit | Attending: Obstetrics and Gynecology | Admitting: Obstetrics and Gynecology

## 2017-05-04 ENCOUNTER — Other Ambulatory Visit: Payer: Self-pay | Admitting: Obstetrics and Gynecology

## 2017-05-04 DIAGNOSIS — R921 Mammographic calcification found on diagnostic imaging of breast: Secondary | ICD-10-CM

## 2017-05-04 DIAGNOSIS — R928 Other abnormal and inconclusive findings on diagnostic imaging of breast: Secondary | ICD-10-CM

## 2017-05-06 ENCOUNTER — Ambulatory Visit
Admission: RE | Admit: 2017-05-06 | Discharge: 2017-05-06 | Disposition: A | Discharge status: Home / Self Care | Payer: BC Managed Care – PPO | Source: Ambulatory Visit | Attending: Obstetrics and Gynecology | Admitting: Obstetrics and Gynecology

## 2017-05-06 DIAGNOSIS — R921 Mammographic calcification found on diagnostic imaging of breast: Secondary | ICD-10-CM

## 2017-05-24 ENCOUNTER — Ambulatory Visit: Payer: Self-pay | Admitting: Surgery

## 2017-05-24 DIAGNOSIS — N6092 Unspecified benign mammary dysplasia of left breast: Secondary | ICD-10-CM

## 2017-05-24 NOTE — H&P (Signed)
History of Present Illness Wilmon Arms. Keandre Linden MD; 05/24/2017 5:21 PM) The patient is a 51 year old female who presents with a breast mass. Ref: Dr. Amie Portland for left breast ADH  PCP - [place] [place], [place]  This is a 51 year old female who is 2 years status post her last screening mammogram. She had a screening mammogram on 04/26/17 that showed some calcifications in the left breast. Diagnostic mammogram (no ultrasound) showed a 6 mm group of amorphous calcifications in the upper-inner quadrant of the left breast posterior depth. She underwent stereotactic biopsy on 05/06/17 with clip placement. 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 The patient presents now to discuss surgical management.  Menarche age 36 First pregnancy age 12 She did not breast-feed Oral contraceptives 5 years The patient is perimenopausal. Family history she has 3 maternal aunts that all had breast cancer but no first-degree relatives.  CLINICAL DATA: Screening.  EXAM: 2D DIGITAL SCREENING BILATERAL MAMMOGRAM WITH CAD AND ADJUNCT TOMO  COMPARISON: Previous exam(s).  ACR Breast Density Category d: The breast tissue is extremely dense, which lowers the sensitivity of mammography.  FINDINGS: In the left breast, calcifications warrant further evaluation. In the right breast, no findings suspicious for malignancy. Images were processed with CAD.  IMPRESSION: Further evaluation is suggested for calcifications in the left breast.  RECOMMENDATION: Diagnostic mammogram of the left breast. (Code:[id number])  The patient will be contacted regarding the findings, and additional imaging will be scheduled.  BI-RADS CATEGORY 0: Incomplete. Need additional imaging evaluation and/or prior mammograms for comparison.   Electronically Signed By: Norva Pavlov M.D. On: 04/26/2017 16:16  CLINICAL DATA: Patient returns today to evaluate left  breast calcifications identified on recent screening mammogram.  Strong family history of breast cancer.  EXAM: DIGITAL DIAGNOSTIC LEFT MAMMOGRAM WITH CAD  COMPARISON: Previous exams including recent screening mammogram dated 04/26/2017.  ACR Breast Density Category c: The breast tissue is heterogeneously dense, which may obscure small masses.  FINDINGS: New grouped amorphous calcifications are confirmed within the upper inner quadrant of the left breast, at posterior depth, spanning 6 mm.  Mammographic images were processed with CAD.  IMPRESSION: New grouped amorphous calcifications within the upper inner quadrant of the left breast, at posterior depth, spanning 6 mm. This is a suspicious finding for which stereotactic biopsy is recommended.  RECOMMENDATION: Stereotactic biopsy for the grouped amorphous calcifications within the upper inner quadrant of the left breast.  Stereotactic biopsy is scheduled for July 13th.  I have discussed the findings and recommendations with the patient. Results were also provided in writing at the conclusion of the visit. If applicable, a reminder letter will be sent to the patient regarding the next appointment.  BI-RADS CATEGORY 4: Suspicious.   Electronically Signed By: Bary Richard M.D. On: 05/04/2017 11:13   CLINICAL DATA: Patient presents for stereotactic core needle biopsy of left breast calcifications.  EXAM: LEFT BREAST STEREOTACTIC CORE NEEDLE BIOPSY  COMPARISON: Previous exams.  FINDINGS: The patient and I discussed the procedure of stereotactic-guided biopsy including benefits and alternatives. We discussed the high likelihood of a successful procedure. We discussed the risks of the procedure including infection, bleeding, tissue injury, clip migration, and inadequate sampling. Informed written consent was given. The usual time out protocol was performed immediately prior to the procedure.  Using sterile  technique and 1% Lidocaine as local anesthetic, under stereotactic guidance, a 9 gauge vacuum assisted device was used to perform core needle biopsy of calcifications in the upper inner quadrant  of the left breast using a cranial to caudal approach. Specimen radiograph was performed showing multiple calcifications for which biopsy was performed. Specimens with calcifications are identified for pathology.  Lesion quadrant: Upper inner quadrant  At the conclusion of the procedure, a coil shaped tissue marker clip was deployed into the biopsy cavity. Follow-up 2-view mammogram was performed and dictated separately.  IMPRESSION: Stereotactic-guided biopsy of left breast calcifications. No apparent complications.  Electronically Signed: By: Amie Portlandavid Ormond M.D. On: 05/06/2017 12:04  ADDENDUM: Pathology revealed ATYPICAL DUCTAL HYPERPLASIA WITH CALCIFICATIONS, FLAT EPITHELIAL ATYPIA, CALCIFICATIONS ASSOCIATED WITH BENIGN BREAST TISSUE of the Left breast, upper, inner quadrant. This was found to be concordant by Dr. Amie Portlandavid Ormond, with excision recommended. Pathology results were discussed with the patient by telephone. The patient reported doing well after the biopsy with tenderness at the site. Post biopsy instructions and care were reviewed and questions were answered. The patient was encouraged to call [facility] for any additional concerns. Surgical consultation has been arranged with [facility]. Manus RuddMatthew [facility]iyana [facility]bro [facility] May 24, 2017. Pathology results reported by Rene KocherLynne Bailey, RN on 05/09/2017. Electronically Signed By: Amie Portlandavid Ormond M.D. On: 05/09/2017 14:03        Past Surgical History (Tanisha A. Manson PasseyBrown, RMA; 05/24/2017 2:10 PM) Breast Biopsy Left. Cesarean Section - 1 Oral Surgery  Diagnostic Studies History (Tanisha A. Manson PasseyBrown, RMA; 05/24/2017 2:10 PM) Colonoscopy never Mammogram within last year Pap Smear 1-5 years  ago  Allergies (Tanisha A. Manson PasseyBrown, RMA; 05/24/2017 2:12 PM) No Known Drug Allergies 05/24/2017 Allergies Reconciled  Medication History (Tanisha A. Manson PasseyBrown, RMA; 05/24/2017 2:12 PM) No Current Medications Medications Reconciled  Social History (Tanisha A. Manson PasseyBrown, RMA; 05/24/2017 2:10 PM) Alcohol use Occasional alcohol use. Caffeine use Coffee, Tea. No drug use Tobacco use Never smoker.  Family History (Tanisha A. Manson PasseyBrown, RMA; 05/24/2017 2:10 PM) Colon Polyps Father. Depression Father. Diabetes Mellitus Mother. Hypertension Father.  Pregnancy / Birth History (Tanisha A. Manson PasseyBrown, RMA; 05/24/2017 2:10 PM) Age at menarche 13 years. Gravida 2 Irregular periods Maternal age 50-40 Para 2  Other Problems (Tanisha A. Manson PasseyBrown, RMA; 05/24/2017 2:10 PM) Back Pain     Review of Systems (Tanisha A. Brown RMA; 05/24/2017 2:10 PM) General Not Present- Appetite Loss, Chills, Fatigue, Fever, Night Sweats, Weight Gain and Weight Loss. Skin Not Present- Change in Wart/Mole, Dryness, Hives, Jaundice, New Lesions, Non-Healing Wounds, Rash and Ulcer. HEENT Not Present- Earache, Hearing Loss, Hoarseness, Nose Bleed, Oral Ulcers, Ringing in the Ears, Seasonal Allergies, Sinus Pain, Sore Throat, Visual Disturbances, Wears glasses/contact lenses and Yellow Eyes. Respiratory Not Present- Bloody sputum, Chronic Cough, Difficulty Breathing, Snoring and Wheezing. Breast Present- Breast Mass and Skin Changes. Not Present- Breast Pain and Nipple Discharge. Cardiovascular Not Present- Chest Pain, Difficulty Breathing Lying Down, Leg Cramps, Palpitations, Rapid Heart Rate, Shortness of Breath and Swelling of Extremities. Gastrointestinal Present- Abdominal Pain and Change in Bowel Habits. Not Present- Bloating, Bloody Stool, Chronic diarrhea, Constipation, Difficulty Swallowing, Excessive gas, Gets full quickly at meals, Hemorrhoids, Indigestion, Nausea, Rectal Pain and Vomiting. Female Genitourinary Not  Present- Frequency, Nocturia, Painful Urination, Pelvic Pain and Urgency. Musculoskeletal Present- Back Pain. Not Present- Joint Pain, Joint Stiffness, Muscle Pain, Muscle Weakness and Swelling of Extremities. Neurological Not Present- Decreased Memory, Fainting, Headaches, Numbness, Seizures, Tingling, Tremor, Trouble walking and Weakness. Psychiatric Not Present- Anxiety, Bipolar, Change in Sleep Pattern, Depression, Fearful and Frequent crying. Endocrine Not Present- Cold Intolerance, Excessive Hunger, Hair Changes, Heat Intolerance, Hot flashes and New Diabetes.  Hematology Not Present- Blood Thinners, Easy Bruising, Excessive bleeding, Gland problems, HIV and Persistent Infections.  Vitals (Tanisha A. Brown RMA; 05/24/2017 2:12 PM) 05/24/2017 2:11 PM Weight: 138.8 lb Height: 59in Body Surface Area: 1.58 m Body Mass Index: 28.03 kg/m  Temp.: 97.72F  Pulse: 103 (Regular)  BP: 122/88 (Sitting, Left Arm, Standard)      Physical Exam Molli Hazard(Sabra Sessler K. Linde Wilensky MD; 05/24/2017 5:23 PM)  The physical exam findings are as follows: Note:WDWN in NAD Eyes: Pupils equal, round; sclera anicteric HENT: Oral mucosa moist; good dentition Neck: No masses palpated, no thyromegaly Lungs: CTA bilaterally; normal respiratory effort Breasts: symmetric; no palpable lymphadenopathy; bilateral fibrocystic changes; no nipple retraction or discharge Left upper inner quadrant - some residual ecchymosis and deep hematoma; no other palpable masses in either breast CV: Regular rate and rhythm; no murmurs; extremities well-perfused with no edema Abd: +bowel sounds, soft, non-tender, no palpable organomegaly; no palpable hernias Skin: Warm, dry; no sign of jaundice Psychiatric - alert and oriented x 4; calm mood and affect    Assessment & Plan Molli Hazard(Eloyce Bultman K. Wilder Kurowski MD; 05/24/2017 2:59 PM)  ATYPICAL DUCTAL HYPERPLASIA OF LEFT BREAST (N60.92)  Current Plans Schedule for Surgery - left radioactive seed  localized lumpectomy. The surgical procedure has been discussed with the patient. Potential risks, benefits, alternative treatments, and expected outcomes have been explained. All of the patient's questions at this time have been answered. The likelihood of reaching the patient's treatment goal is good. The patient understand the proposed surgical procedure and wishes to proceed.  Wilmon ArmsMatthew K. Corliss Skainssuei, MD, [facility]  General/ Trauma Surgery  05/24/2017 5:24 PM

## 2017-05-27 ENCOUNTER — Other Ambulatory Visit: Payer: Self-pay | Admitting: Surgery

## 2017-05-27 DIAGNOSIS — N6092 Unspecified benign mammary dysplasia of left breast: Secondary | ICD-10-CM

## 2017-06-10 ENCOUNTER — Encounter (HOSPITAL_BASED_OUTPATIENT_CLINIC_OR_DEPARTMENT_OTHER): Payer: Self-pay | Admitting: *Deleted

## 2017-06-14 ENCOUNTER — Ambulatory Visit
Admission: RE | Admit: 2017-06-14 | Discharge: 2017-06-14 | Disposition: A | Discharge status: Home / Self Care | Payer: BC Managed Care – PPO | Source: Ambulatory Visit | Attending: Surgery | Admitting: Surgery

## 2017-06-14 DIAGNOSIS — N6092 Unspecified benign mammary dysplasia of left breast: Secondary | ICD-10-CM

## 2017-06-14 NOTE — Pre-Procedure Instructions (Signed)
Pre Surgery Ensure given with instructions to finish by 0715 DOS

## 2017-06-16 ENCOUNTER — Ambulatory Visit (HOSPITAL_BASED_OUTPATIENT_CLINIC_OR_DEPARTMENT_OTHER): Payer: BC Managed Care – PPO | Admitting: Anesthesiology

## 2017-06-16 ENCOUNTER — Encounter (HOSPITAL_BASED_OUTPATIENT_CLINIC_OR_DEPARTMENT_OTHER)
Admission: RE | Disposition: A | Discharge status: Home / Self Care | Payer: Self-pay | Source: Ambulatory Visit | Attending: Surgery

## 2017-06-16 ENCOUNTER — Ambulatory Visit
Admission: RE | Admit: 2017-06-16 | Discharge: 2017-06-16 | Disposition: A | Discharge status: Home / Self Care | Payer: BC Managed Care – PPO | Source: Ambulatory Visit | Attending: Surgery | Admitting: Surgery

## 2017-06-16 ENCOUNTER — Encounter (HOSPITAL_BASED_OUTPATIENT_CLINIC_OR_DEPARTMENT_OTHER): Payer: Self-pay | Admitting: *Deleted

## 2017-06-16 ENCOUNTER — Ambulatory Visit (HOSPITAL_BASED_OUTPATIENT_CLINIC_OR_DEPARTMENT_OTHER)
Admission: RE | Admit: 2017-06-16 | Discharge: 2017-06-16 | Disposition: A | Discharge status: Home / Self Care | Payer: BC Managed Care – PPO | Source: Ambulatory Visit | Attending: Surgery | Admitting: Surgery

## 2017-06-16 DIAGNOSIS — N6022 Fibroadenosis of left breast: Secondary | ICD-10-CM | POA: Diagnosis not present

## 2017-06-16 DIAGNOSIS — N6092 Unspecified benign mammary dysplasia of left breast: Secondary | ICD-10-CM

## 2017-06-16 DIAGNOSIS — N6320 Unspecified lump in the left breast, unspecified quadrant: Secondary | ICD-10-CM | POA: Diagnosis present

## 2017-06-16 HISTORY — DX: Unspecified lump in the left breast, unspecified quadrant: N63.20

## 2017-06-16 HISTORY — PX: BREAST LUMPECTOMY WITH RADIOACTIVE SEED LOCALIZATION: SHX6424

## 2017-06-16 HISTORY — PX: BREAST EXCISIONAL BIOPSY: SUR124

## 2017-06-16 HISTORY — DX: Methicillin resistant Staphylococcus aureus infection, unspecified site: A49.02

## 2017-06-16 SURGERY — BREAST LUMPECTOMY WITH RADIOACTIVE SEED LOCALIZATION
Anesthesia: General | Site: Breast | Laterality: Left

## 2017-06-16 MED ORDER — LIDOCAINE 2% (20 MG/ML) 5 ML SYRINGE
INTRAMUSCULAR | Status: AC
Start: 2017-06-16 — End: 2017-06-16
  Filled 2017-06-16: qty 5

## 2017-06-16 MED ORDER — MIDAZOLAM HCL 2 MG/2ML IJ SOLN
INTRAMUSCULAR | Status: AC
  Filled 2017-06-16: qty 2

## 2017-06-16 MED ORDER — SCOPOLAMINE 1 MG/3DAYS TD PT72: 1.0000 | MEDICATED_PATCH | Freq: Once | TRANSDERMAL | Status: DC | PRN

## 2017-06-16 MED ORDER — GABAPENTIN 300 MG PO CAPS
300.0000 mg | ORAL_CAPSULE | ORAL | Status: AC
  Administered 2017-06-16: 300 mg via ORAL

## 2017-06-16 MED ORDER — GABAPENTIN 300 MG PO CAPS
ORAL_CAPSULE | ORAL | Status: AC
  Filled 2017-06-16: qty 1

## 2017-06-16 MED ORDER — FENTANYL CITRATE (PF) 100 MCG/2ML IJ SOLN
INTRAMUSCULAR | Status: AC
  Filled 2017-06-16: qty 2

## 2017-06-16 MED ORDER — DEXAMETHASONE SODIUM PHOSPHATE 4 MG/ML IJ SOLN
INTRAMUSCULAR | Status: DC | PRN
  Administered 2017-06-16: 10 mg via INTRAVENOUS

## 2017-06-16 MED ORDER — PROPOFOL 500 MG/50ML IV EMUL
INTRAVENOUS | Status: AC
  Filled 2017-06-16: qty 50

## 2017-06-16 MED ORDER — OXYCODONE HCL 5 MG/5ML PO SOLN: 5.0000 mg | Freq: Once | ORAL | Status: DC | PRN

## 2017-06-16 MED ORDER — LIDOCAINE 2% (20 MG/ML) 5 ML SYRINGE
INTRAMUSCULAR | Status: DC | PRN
  Administered 2017-06-16: 50 mg via INTRAVENOUS

## 2017-06-16 MED ORDER — CEFAZOLIN SODIUM-DEXTROSE 2-4 GM/100ML-% IV SOLN
INTRAVENOUS | Status: AC
  Filled 2017-06-16: qty 100

## 2017-06-16 MED ORDER — CELECOXIB 200 MG PO CAPS
ORAL_CAPSULE | ORAL | Status: AC
  Filled 2017-06-16: qty 2

## 2017-06-16 MED ORDER — CELECOXIB 400 MG PO CAPS
400.0000 mg | ORAL_CAPSULE | ORAL | Status: AC
  Administered 2017-06-16: 400 mg via ORAL

## 2017-06-16 MED ORDER — ACETAMINOPHEN 160 MG/5ML PO SOLN: 325.0000 mg | ORAL | Status: DC | PRN

## 2017-06-16 MED ORDER — MEPERIDINE HCL 25 MG/ML IJ SOLN: 6.2500 mg | INTRAMUSCULAR | Status: DC | PRN

## 2017-06-16 MED ORDER — KETOROLAC TROMETHAMINE 30 MG/ML IJ SOLN: 30.0000 mg | Freq: Once | INTRAMUSCULAR | Status: DC | PRN

## 2017-06-16 MED ORDER — ONDANSETRON HCL 4 MG/2ML IJ SOLN
INTRAMUSCULAR | Status: AC
Start: 2017-06-16 — End: 2017-06-16
  Filled 2017-06-16: qty 2

## 2017-06-16 MED ORDER — CEFAZOLIN SODIUM-DEXTROSE 2-4 GM/100ML-% IV SOLN
2.0000 g | INTRAVENOUS | Status: AC
  Administered 2017-06-16: 2 g via INTRAVENOUS

## 2017-06-16 MED ORDER — CHLORHEXIDINE GLUCONATE CLOTH 2 % EX PADS: 6.0000 | MEDICATED_PAD | Freq: Once | CUTANEOUS | Status: DC

## 2017-06-16 MED ORDER — ACETAMINOPHEN 500 MG PO TABS
1000.0000 mg | ORAL_TABLET | ORAL | Status: AC
  Administered 2017-06-16: 1000 mg via ORAL

## 2017-06-16 MED ORDER — FENTANYL CITRATE (PF) 100 MCG/2ML IJ SOLN
50.0000 ug | INTRAMUSCULAR | Status: DC | PRN
  Administered 2017-06-16: 100 ug via INTRAVENOUS

## 2017-06-16 MED ORDER — ACETAMINOPHEN 500 MG PO TABS
ORAL_TABLET | ORAL | Status: AC
  Filled 2017-06-16: qty 2

## 2017-06-16 MED ORDER — HYDROCODONE-ACETAMINOPHEN 5-325 MG PO TABS: 1.0000 | ORAL_TABLET | Freq: Four times a day (QID) | ORAL | 0 refills | Status: DC | PRN

## 2017-06-16 MED ORDER — OXYCODONE HCL 5 MG PO TABS: 5.0000 mg | ORAL_TABLET | Freq: Once | ORAL | Status: DC | PRN

## 2017-06-16 MED ORDER — MIDAZOLAM HCL 2 MG/2ML IJ SOLN
1.0000 mg | INTRAMUSCULAR | Status: DC | PRN
  Administered 2017-06-16: 2 mg via INTRAVENOUS

## 2017-06-16 MED ORDER — LACTATED RINGERS IV SOLN
INTRAVENOUS | Status: DC
  Administered 2017-06-16: 10:00:00 via INTRAVENOUS

## 2017-06-16 MED ORDER — DEXAMETHASONE SODIUM PHOSPHATE 10 MG/ML IJ SOLN
INTRAMUSCULAR | Status: AC
Start: 2017-06-16 — End: 2017-06-16
  Filled 2017-06-16: qty 1

## 2017-06-16 MED ORDER — ONDANSETRON HCL 4 MG/2ML IJ SOLN: 4.0000 mg | Freq: Once | INTRAMUSCULAR | Status: DC | PRN

## 2017-06-16 MED ORDER — ONDANSETRON HCL 4 MG/2ML IJ SOLN
INTRAMUSCULAR | Status: DC | PRN
  Administered 2017-06-16: 4 mg via INTRAVENOUS

## 2017-06-16 MED ORDER — FENTANYL CITRATE (PF) 100 MCG/2ML IJ SOLN: 25.0000 ug | INTRAMUSCULAR | Status: DC | PRN

## 2017-06-16 MED ORDER — PROPOFOL 10 MG/ML IV BOLUS
INTRAVENOUS | Status: DC | PRN
  Administered 2017-06-16: 150 mg via INTRAVENOUS

## 2017-06-16 MED ORDER — ACETAMINOPHEN 325 MG PO TABS: 325.0000 mg | ORAL_TABLET | ORAL | Status: DC | PRN

## 2017-06-16 MED ORDER — BUPIVACAINE-EPINEPHRINE 0.25% -1:200000 IJ SOLN
INTRAMUSCULAR | Status: DC | PRN
  Administered 2017-06-16: 10 mL

## 2017-06-16 SURGICAL SUPPLY — 54 items
APL SKNCLS STERI-STRIP NONHPOA (GAUZE/BANDAGES/DRESSINGS) ×1
APPLIER CLIP 9.375 MED OPEN (MISCELLANEOUS) ×3
APR CLP MED 9.3 20 MLT OPN (MISCELLANEOUS) ×1
BENZOIN TINCTURE PRP APPL 2/3 (GAUZE/BANDAGES/DRESSINGS) ×3 IMPLANT
BLADE HEX COATED 2.75 (ELECTRODE) ×3 IMPLANT
BLADE SURG 15 STRL LF DISP TIS (BLADE) ×1 IMPLANT
BLADE SURG 15 STRL SS (BLADE) ×3
CANISTER SUC SOCK COL 7IN (MISCELLANEOUS) IMPLANT
CANISTER SUCT 1200ML W/VALVE (MISCELLANEOUS) ×3 IMPLANT
CHLORAPREP W/TINT 26ML (MISCELLANEOUS) ×3 IMPLANT
CLIP APPLIE 9.375 MED OPEN (MISCELLANEOUS) ×1 IMPLANT
CLOSURE WOUND 1/2 X4 (GAUZE/BANDAGES/DRESSINGS) ×1
COVER BACK TABLE 60X90IN (DRAPES) ×3 IMPLANT
COVER MAYO STAND STRL (DRAPES) ×3 IMPLANT
COVER PROBE W GEL 5X96 (DRAPES) ×3 IMPLANT
DEVICE DUBIN W/COMP PLATE 8390 (MISCELLANEOUS) ×3 IMPLANT
DRAPE LAPAROTOMY 100X72 PEDS (DRAPES) ×3 IMPLANT
DRAPE UTILITY XL STRL (DRAPES) ×3 IMPLANT
DRSG TEGADERM 4X4.75 (GAUZE/BANDAGES/DRESSINGS) ×3 IMPLANT
ELECT REM PT RETURN 9FT ADLT (ELECTROSURGICAL) ×3
ELECTRODE REM PT RTRN 9FT ADLT (ELECTROSURGICAL) ×1 IMPLANT
GAUZE SPONGE 4X4 12PLY STRL LF (GAUZE/BANDAGES/DRESSINGS) IMPLANT
GLOVE BIO SURGEON STRL SZ7 (GLOVE) ×3 IMPLANT
GLOVE BIO SURGEON STRL SZ8 (GLOVE) ×3 IMPLANT
GLOVE BIOGEL PI IND STRL 7.5 (GLOVE) ×1 IMPLANT
GLOVE BIOGEL PI IND STRL 8 (GLOVE) IMPLANT
GLOVE BIOGEL PI INDICATOR 7.5 (GLOVE) ×2
GLOVE BIOGEL PI INDICATOR 8 (GLOVE) ×2
GOWN STRL REUS W/ TWL LRG LVL3 (GOWN DISPOSABLE) ×2 IMPLANT
GOWN STRL REUS W/TWL LRG LVL3 (GOWN DISPOSABLE) ×6
ILLUMINATOR WAVEGUIDE N/F (MISCELLANEOUS) ×3 IMPLANT
KIT MARKER MARGIN INK (KITS) ×3 IMPLANT
NDL HYPO 25X1 1.5 SAFETY (NEEDLE) ×1 IMPLANT
NEEDLE HYPO 25X1 1.5 SAFETY (NEEDLE) ×3 IMPLANT
NS IRRIG 1000ML POUR BTL (IV SOLUTION) ×3 IMPLANT
PACK BASIN DAY SURGERY FS (CUSTOM PROCEDURE TRAY) ×3 IMPLANT
PENCIL BUTTON HOLSTER BLD 10FT (ELECTRODE) ×3 IMPLANT
SLEEVE SCD COMPRESS KNEE MED (MISCELLANEOUS) ×3 IMPLANT
SPONGE GAUZE 2X2 8PLY STER LF (GAUZE/BANDAGES/DRESSINGS)
SPONGE GAUZE 2X2 8PLY STRL LF (GAUZE/BANDAGES/DRESSINGS) IMPLANT
SPONGE LAP 18X18 X RAY DECT (DISPOSABLE) IMPLANT
SPONGE LAP 4X18 X RAY DECT (DISPOSABLE) ×3 IMPLANT
STRIP CLOSURE SKIN 1/2X4 (GAUZE/BANDAGES/DRESSINGS) ×2 IMPLANT
SUT MON AB 4-0 PC3 18 (SUTURE) ×3 IMPLANT
SUT SILK 2 0 SH (SUTURE) IMPLANT
SUT VIC AB 3-0 SH 27 (SUTURE) ×3
SUT VIC AB 3-0 SH 27X BRD (SUTURE) ×1 IMPLANT
SYR BULB 3OZ (MISCELLANEOUS) IMPLANT
SYR CONTROL 10ML LL (SYRINGE) ×3 IMPLANT
TOWEL OR 17X24 6PK STRL BLUE (TOWEL DISPOSABLE) ×3 IMPLANT
TOWEL OR NON WOVEN STRL DISP B (DISPOSABLE) ×3 IMPLANT
TUBE CONNECTING 20'X1/4 (TUBING) ×1
TUBE CONNECTING 20X1/4 (TUBING) ×2 IMPLANT
YANKAUER SUCT BULB TIP NO VENT (SUCTIONS) ×3 IMPLANT

## 2017-06-16 NOTE — Anesthesia Postprocedure Evaluation (Signed)
Anesthesia Post Note  Patient: [name]  Procedure(s) Performed: Procedure(s) (LRB): LEFT BREAST LUMPECTOMY WITH RADIOACTIVE SEED LOCALIZATION (Left)     Patient location during evaluation: PACU Anesthesia Type: General Level of consciousness: awake Pain management: pain level controlled Vital Signs Assessment: post-procedure vital signs reviewed and stable Respiratory status: spontaneous breathing Cardiovascular status: stable Postop Assessment: no signs of nausea or vomiting Anesthetic complications: no    Last Vitals:  Vitals:   06/16/17 1245 06/16/17 1324  BP: 139/76 128/84  Pulse: 100 80  Resp: 16 16  Temp:  36.6 C  SpO2: 100% 100%    Last Pain:  Vitals:   06/16/17 1324  TempSrc: Oral  PainSc:                  Lunden Stieber

## 2017-06-16 NOTE — Interval H&P Note (Signed)
History and Physical Interval Note:  06/16/2017 10:04 AM  [name]  has presented today for surgery, with the diagnosis of Left breast atypical ductal hyperplasia  The various methods of treatment have been discussed with the patient and family. After consideration of risks, benefits and other options for treatment, the patient has consented to  Procedure(s) with comments: LEFT BREAST LUMPECTOMY WITH RADIOACTIVE SEED LOCALIZATION ERAS PATHWAY (Left) - ERAS PATHWAY as a surgical intervention .  The patient's history has been reviewed, patient examined, no change in status, stable for surgery.  I have reviewed the patient's chart and labs.  Questions were answered to the patient's satisfaction.     Keniel Ralston K.

## 2017-06-16 NOTE — Anesthesia Procedure Notes (Signed)
Procedure Name: LMA Insertion Date/Time: 06/16/2017 11:00 AM Performed by: Caren Macadam Pre-anesthesia Checklist: Patient identified, Emergency Drugs available, Suction available and Patient being monitored Patient Re-evaluated:Patient Re-evaluated prior to induction Oxygen Delivery Method: Circle system utilized Preoxygenation: Pre-oxygenation with 100% oxygen Induction Type: IV induction Ventilation: Mask ventilation without difficulty LMA: LMA inserted LMA Size: 3.0 Number of attempts: 1 Placement Confirmation: positive ETCO2 and breath sounds checked- equal and bilateral Tube secured with: Tape Dental Injury: Teeth and Oropharynx as per pre-operative assessment

## 2017-06-16 NOTE — Discharge Instructions (Signed)
[facility] °Office Phone Number [phone] ° °BREAST BIOPSY/ PARTIAL MASTECTOMY: POST OP INSTRUCTIONS ° °Always review your discharge instruction sheet given to you by the facility where your surgery was performed. ° °IF YOU HAVE DISABILITY OR FAMILY LEAVE FORMS, YOU MUST BRING THEM TO THE OFFICE FOR PROCESSING.  DO NOT GIVE THEM TO YOUR DOCTOR. ° °1. A prescription for pain medication may be given to you upon discharge.  Take your pain medication as prescribed, if needed.  If narcotic pain medicine is not needed, then you may take acetaminophen (Tylenol) or ibuprofen (Advil) as needed. °2. Take your usually prescribed medications unless otherwise directed °3. If you need a refill on your pain medication, please contact your pharmacy.  They will contact our office to request authorization.  Prescriptions will not be filled after 5pm or on week-ends. °4. You should eat very light the first 24 hours after surgery, such as soup, crackers, pudding, etc.  Resume your normal diet the day after surgery. °5. Most patients will experience some swelling and bruising in the breast.  Ice packs and a good support bra will help.  Swelling and bruising can take several days to resolve.  °6. It is common to experience some constipation if taking pain medication after surgery.  Increasing fluid intake and taking a stool softener will usually help or prevent this problem from occurring.  A mild laxative (Milk of Magnesia or Miralax) should be taken according to package directions if there are no bowel movements after 48 hours. °7. Unless discharge instructions indicate otherwise, you may remove your bandages 24-48 hours after surgery, and you may shower at that time.  You may have steri-strips (small skin tapes) in place directly over the incision.  These strips should be left on the skin for 7-10 days.  If your surgeon used skin glue on the incision, you may shower in 24 hours.  The glue will flake off over the  next 2-3 weeks.  Any sutures or staples will be removed at the office during your follow-up visit. °8. ACTIVITIES:  You may resume regular daily activities (gradually increasing) beginning the next day.  Wearing a good support bra or sports bra minimizes pain and swelling.  You may have sexual intercourse when it is comfortable. °a. You may drive when you no longer are taking prescription pain medication, you can comfortably wear a seatbelt, and you can safely maneuver your car and apply brakes. °b. RETURN TO WORK:  ______________________________________________________________________________________ °9. You should see your doctor in the office for a follow-up appointment approximately two weeks after your surgery.  Your doctor’s nurse will typically make your follow-up appointment when she calls you with your pathology report.  Expect your pathology report 2-3 business days after your surgery.  You may call to check if you do not hear from us after three days. °10. OTHER INSTRUCTIONS: _______________________________________________________________________________________________ _____________________________________________________________________________________________________________________________________ °_____________________________________________________________________________________________________________________________________ °_____________________________________________________________________________________________________________________________________ ° °WHEN TO CALL YOUR DOCTOR: °1. Fever over 101.0 °2. Nausea and/or vomiting. °3. Extreme swelling or bruising. °4. Continued bleeding from incision. °5. Increased pain, redness, or drainage from the incision. ° °The clinic staff is available to answer your questions during regular business hours.  Please don’t hesitate to call and ask to speak to one of the nurses for clinical concerns.  If you have a medical emergency, go to the nearest  emergency room or call 911.  A surgeon from [facility] is always on call at the hospital. ° °For further questions, please visit [email or web address]  ° ° ° ° °  Post Anesthesia Home Care Instructions ° °Activity: °Get plenty of rest for the remainder of the day. A responsible individual must stay with you for 24 hours following the procedure.  °For the next 24 hours, DO NOT: °-Drive a car °-Operate machinery °-Drink alcoholic beverages °-Take any medication unless instructed by your physician °-Make any legal decisions or sign important papers. ° °Meals: °Start with liquid foods such as gelatin or soup. Progress to regular foods as tolerated. Avoid greasy, spicy, heavy foods. If nausea and/or vomiting occur, drink only clear liquids until the nausea and/or vomiting subsides. Call your physician if vomiting continues. ° °Special Instructions/Symptoms: °Your throat may feel dry or sore from the anesthesia or the breathing tube placed in your throat during surgery. If this causes discomfort, gargle with warm salt water. The discomfort should disappear within 24 hours. ° °If you had a scopolamine patch placed behind your ear for the management of post- operative nausea and/or vomiting: ° °1. The medication in the patch is effective for 72 hours, after which it should be removed.  Wrap patch in a tissue and discard in the trash. Wash hands thoroughly with soap and water. °2. You may remove the patch earlier than 72 hours if you experience unpleasant side effects which may include dry mouth, dizziness or visual disturbances. °3. Avoid touching the patch. Wash your hands with soap and water after contact with the patch. °  ° °

## 2017-06-16 NOTE — Transfer of Care (Signed)
Immediate Anesthesia Transfer of Care Note  Patient: [name]  Procedure(s) Performed: Procedure(s): LEFT BREAST LUMPECTOMY WITH RADIOACTIVE SEED LOCALIZATION (Left)  Patient Location: PACU  Anesthesia Type:General  Level of Consciousness: awake and alert   Airway & Oxygen Therapy: Patient Spontanous Breathing and Patient connected to face mask oxygen  Post-op Assessment: Report given to RN and Post -op Vital signs reviewed and stable  Post vital signs: Reviewed and stable  Last Vitals:  Vitals:   06/16/17 0930  BP: 137/82  Pulse: (!) 103  Resp: 18  Temp: 36.6 C  SpO2: 100%    Last Pain:  Vitals:   06/16/17 0930  TempSrc: Oral      Patients Stated Pain Goal: 0 (06/16/17 0930)  Complications: No apparent anesthesia complications

## 2017-06-16 NOTE — Anesthesia Preprocedure Evaluation (Signed)
Anesthesia Evaluation  Patient identified by MRN, date of birth, ID band Patient awake    Reviewed: Allergy & Precautions, NPO status , Patient's Chart, lab work & pertinent test results  Airway Mallampati: II  TM Distance: >3 FB     Dental   Pulmonary neg pulmonary ROS,    breath sounds clear to auscultation       Cardiovascular negative cardio ROS   Rhythm:Regular Rate:Normal     Neuro/Psych    GI/Hepatic negative GI ROS, Neg liver ROS,   Endo/Other  negative endocrine ROS  Renal/GU negative Renal ROS     Musculoskeletal   Abdominal   Peds  Hematology   Anesthesia Other Findings   Reproductive/Obstetrics                             Anesthesia Physical Anesthesia Plan  ASA: III  Anesthesia Plan: General   Post-op Pain Management:    Induction: Intravenous  PONV Risk Score and Plan: 3 and Ondansetron, Dexamethasone, Midazolam and Propofol infusion  Airway Management Planned: LMA  Additional Equipment:   Intra-op Plan:   Post-operative Plan: Extubation in OR  Informed Consent: I have reviewed the patients History and Physical, chart, labs and discussed the procedure including the risks, benefits and alternatives for the proposed anesthesia with the patient or authorized representative who has indicated his/her understanding and acceptance.   Dental advisory given  Plan Discussed with: CRNA and Anesthesiologist  Anesthesia Plan Comments:         Anesthesia Quick Evaluation

## 2017-06-16 NOTE — Op Note (Signed)
Pre-op Diagnosis:  Left breast mass Post-op Diagnosis: same Procedure:  left radioactive seed localized lumpectomy Surgeon:  Shizuko Wojdyla K. Anesthesia:  GEN - LMA Indications: This is a 51 year old female who is 2 years status post her last screening mammogram. She had a screening mammogram on 04/26/17 that showed some calcifications in the left breast. Diagnostic mammogram (no ultrasound) showed a 6 mm group of amorphous calcifications in the upper-inner quadrant of the left breast posterior depth. She underwent stereotactic biopsy on 05/06/17 with clip placement. 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 The patient presents now for surgical management.    Description of procedure: The patient is brought to the operating room placed in supine position on the operating room table. After an adequate level of general anesthesia was obtained, her left breast was prepped with ChloraPrep and draped sterile fashion. A timeout was taken to ensure the proper patient and proper procedure. We interrogated the breast with the neoprobe. We made a circumareolar incision around the lateral side of the nipple after infiltrating with 0.25% Marcaine. Dissection was carried down in the breast tissue with cautery, using the lighted retractor for exposure. We used the neoprobe to guide [place] towards the radioactive seed. We excised an area of tissue around the radioactive seed 2.5 cm in diameter. The specimen was removed and was oriented with a paint kit. Specimen mammogram showed the radioactive seed as well as the biopsy clip within the specimen. This was sent for pathologic examination. There is no residual radioactivity within the biopsy cavity. We inspected carefully for hemostasis. The wound was thoroughly irrigated. The wound was closed with a deep layer of 3-0 Vicryl and a subcuticular layer of 4-0 Monocryl. Benzoin Steri-Strips were applied. The patient was then  extubated and brought to the recovery room in stable condition. All sponge, instrument, and needle counts are correct.  Imogene Burn. Georgette Dover, MD, [facility]  General/ Trauma Surgery  06/16/2017 12:19 PM

## 2017-06-16 NOTE — H&P (View-Only) (Signed)
History of Present Illness [name]. Jessica Hackman MD; 05/24/2017 5:21 PM) The patient is a 51 year old female who presents with a breast mass. Ref: Dr. Amie Whitehead for left breast ADH  PCP - Jessica Kim, PA  This is a 51 year old female who is 2 years status post her last screening mammogram. She had a screening mammogram on 04/26/17 that showed some calcifications in the left breast. Diagnostic mammogram (no ultrasound) showed a 6 mm group of amorphous calcifications in the upper-inner quadrant of the left breast posterior depth. She underwent stereotactic biopsy on 05/06/17 with clip placement. 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 The patient presents now to discuss surgical management.  Menarche age 36 First pregnancy age 12 She did not breast-feed Oral contraceptives 5 years The patient is perimenopausal. Family history she has 3 maternal aunts that all had breast cancer but no first-degree relatives.  CLINICAL DATA: Screening.  EXAM: 2D DIGITAL SCREENING BILATERAL MAMMOGRAM WITH CAD AND ADJUNCT TOMO  COMPARISON: Previous exam(s).  ACR Breast Density Category d: The breast tissue is extremely dense, which lowers the sensitivity of mammography.  FINDINGS: In the left breast, calcifications warrant further evaluation. In the right breast, no findings suspicious for malignancy. Images were processed with CAD.  IMPRESSION: Further evaluation is suggested for calcifications in the left breast.  RECOMMENDATION: Diagnostic mammogram of the left breast. (Code:[id number])  The patient will be contacted regarding the findings, and additional imaging will be scheduled.  BI-RADS CATEGORY 0: Incomplete. Need additional imaging evaluation and/or prior mammograms for comparison.   Electronically Signed By: [name] M.D. On: 04/26/2017 16:16  CLINICAL DATA: Patient returns today to evaluate left  breast calcifications identified on recent screening mammogram.  Strong family history of breast cancer.  EXAM: DIGITAL DIAGNOSTIC LEFT MAMMOGRAM WITH CAD  COMPARISON: Previous exams including recent screening mammogram dated 04/26/2017.  ACR Breast Density Category c: The breast tissue is heterogeneously dense, which may obscure small masses.  FINDINGS: New grouped amorphous calcifications are confirmed within the upper inner quadrant of the left breast, at posterior depth, spanning 6 mm.  Mammographic images were processed with CAD.  IMPRESSION: New grouped amorphous calcifications within the upper inner quadrant of the left breast, at posterior depth, spanning 6 mm. This is a suspicious finding for which stereotactic biopsy is recommended.  RECOMMENDATION: Stereotactic biopsy for the grouped amorphous calcifications within the upper inner quadrant of the left breast.  Stereotactic biopsy is scheduled for July 13th.  I have discussed the findings and recommendations with the patient. Results were also provided in writing at the conclusion of the visit. If applicable, a reminder letter will be sent to the patient regarding the next appointment.  BI-RADS CATEGORY 4: Suspicious.   Electronically Signed By: [name] M.D. On: 05/04/2017 11:13   CLINICAL DATA: Patient presents for stereotactic core needle biopsy of left breast calcifications.  EXAM: LEFT BREAST STEREOTACTIC CORE NEEDLE BIOPSY  COMPARISON: Previous exams.  FINDINGS: The patient and I discussed the procedure of stereotactic-guided biopsy including benefits and alternatives. We discussed the high likelihood of a successful procedure. We discussed the risks of the procedure including infection, bleeding, tissue injury, clip migration, and inadequate sampling. Informed written consent was given. The usual time out protocol was performed immediately prior to the procedure.  Using sterile  technique and 1% Lidocaine as local anesthetic, under stereotactic guidance, a 9 gauge vacuum assisted device was used to perform core needle biopsy of calcifications in the upper inner quadrant  of the left breast using a cranial to caudal approach. Specimen radiograph was performed showing multiple calcifications for which biopsy was performed. Specimens with calcifications are identified for pathology.  Lesion quadrant: Upper inner quadrant  At the conclusion of the procedure, a coil shaped tissue marker clip was deployed into the biopsy cavity. Follow-up 2-view mammogram was performed and dictated separately.  IMPRESSION: Stereotactic-guided biopsy of left breast calcifications. No apparent complications.  Electronically Signed: By: Jessica Portlandavid Whitehead M.D. On: 05/06/2017 12:04  ADDENDUM: Pathology revealed ATYPICAL DUCTAL HYPERPLASIA WITH CALCIFICATIONS, FLAT EPITHELIAL ATYPIA, CALCIFICATIONS ASSOCIATED WITH BENIGN BREAST TISSUE of the Left breast, upper, inner quadrant. This was found to be concordant by Dr. Amie Portlandavid Whitehead, with excision recommended. Pathology results were discussed with the patient by telephone. The patient reported doing well after the biopsy with tenderness at the site. Post biopsy instructions and care were reviewed and questions were answered. The patient was encouraged to call [facility] for any additional concerns. Surgical consultation has been arranged with [facility] Manus [facility]uddMatthew [facility]u [facility] on May 24, 2017. Pathology results reported by Jessica KocherLynne Bailey, RN on 05/09/2017. Electronically Signed By: Jessica Portlandavid Whitehead M.D. On: 05/09/2017 14:03        Past Surgical History ([name], RMA; 05/24/2017 2:10 PM) Breast Biopsy Left. Cesarean Section - 1 Oral Surgery  Diagnostic Studies History ([name], RMA; 05/24/2017 2:10 PM) Colonoscopy never Mammogram within last year Pap Smear 1-5 years  ago  Allergies ([name], RMA; 05/24/2017 2:12 PM) No Known Drug Allergies 05/24/2017 Allergies Reconciled  Medication History ([name], RMA; 05/24/2017 2:12 PM) No Current Medications Medications Reconciled  Social History ([name], RMA; 05/24/2017 2:10 PM) Alcohol use Occasional alcohol use. Caffeine use Coffee, Tea. No drug use Tobacco use Never smoker.  Family History ([name], RMA; 05/24/2017 2:10 PM) Colon Polyps Father. Depression Father. Diabetes Mellitus Mother. Hypertension Father.  Pregnancy / Birth History ([name], RMA; 05/24/2017 2:10 PM) Age at menarche 13 years. Gravida 2 Irregular periods Maternal age 50-40 Para 2  Other Problems ([name], RMA; 05/24/2017 2:10 PM) Back Pain     Review of Systems (Jessica A. Brown RMA; 05/24/2017 2:10 PM) General Not Present- Appetite Loss, Chills, Fatigue, Fever, Night Sweats, Weight Gain and Weight Loss. Skin Not Present- Change in Wart/Mole, Dryness, Hives, Jaundice, New Lesions, Non-Healing Wounds, Rash and Ulcer. HEENT Not Present- Earache, Hearing Loss, Hoarseness, Nose Bleed, Oral Ulcers, Ringing in the Ears, Seasonal Allergies, Sinus Pain, Sore Throat, Visual Disturbances, Wears glasses/contact lenses and Yellow Eyes. Respiratory Not Present- Bloody sputum, Chronic Cough, Difficulty Breathing, Snoring and Wheezing. Breast Present- Breast Mass and Skin Changes. Not Present- Breast Pain and Nipple Discharge. Cardiovascular Not Present- Chest Pain, Difficulty Breathing Lying Down, Leg Cramps, Palpitations, Rapid Heart Rate, Shortness of Breath and Swelling of Extremities. Gastrointestinal Present- Abdominal Pain and Change in Bowel Habits. Not Present- Bloating, Bloody Stool, Chronic diarrhea, Constipation, Difficulty Swallowing, Excessive gas, Gets full quickly at meals, Hemorrhoids, Indigestion, Nausea, Rectal Pain and Vomiting. Female Genitourinary Not  Present- Frequency, Nocturia, Painful Urination, Pelvic Pain and Urgency. Musculoskeletal Present- Back Pain. Not Present- Joint Pain, Joint Stiffness, Muscle Pain, Muscle Weakness and Swelling of Extremities. Neurological Not Present- Decreased Memory, Fainting, Headaches, Numbness, Seizures, Tingling, Tremor, Trouble walking and Weakness. Psychiatric Not Present- Anxiety, Bipolar, Change in Sleep Pattern, Depression, Fearful and Frequent crying. Endocrine Not Present- Cold Intolerance, Excessive Hunger, Hair Changes, Heat Intolerance, Hot flashes and New Diabetes.  Hematology Not Present- Blood Thinners, Easy Bruising, Excessive bleeding, Gland problems, HIV and Persistent Infections.  Vitals (Jessica A. Brown RMA; 05/24/2017 2:12 PM) 05/24/2017 2:11 PM Weight: 138.8 lb Height: 59in Body Surface Area: 1.58 m Body Mass Index: 28.03 kg/m  Temp.: 97.7F  Pulse: 103 (Regular)  BP: 122/88 (Sitting, Left Arm, Standard)      Physical Exam (Bransyn Adami K. Zakariah Urwin MD; 05/24/2017 5:23 PM)  The physical exam findings are as follows: Note:WDWN in NAD Eyes: Pupils equal, round; sclera anicteric HENT: Oral mucosa moist; good dentition Neck: No masses palpated, no thyromegaly Lungs: CTA bilaterally; normal respiratory effort Breasts: symmetric; no palpable lymphadenopathy; bilateral fibrocystic changes; no nipple retraction or discharge Left upper inner quadrant - some residual ecchymosis and deep hematoma; no other palpable masses in either breast CV: Regular rate and rhythm; no murmurs; extremities well-perfused with no edema Abd: +bowel sounds, soft, non-tender, no palpable organomegaly; no palpable hernias Skin: Warm, dry; no sign of jaundice Psychiatric - alert and oriented x 4; calm mood and affect    Assessment & Plan (Shikara Mcauliffe K. Dresean Beckel MD; 05/24/2017 2:59 PM)  ATYPICAL DUCTAL HYPERPLASIA OF LEFT BREAST (N60.92)  Current Plans Schedule for Surgery - left radioactive seed  localized lumpectomy. The surgical procedure has been discussed with the patient. Potential risks, benefits, alternative treatments, and expected outcomes have been explained. All of the patient's questions at this time have been answered. The likelihood of reaching the patient's treatment goal is good. The patient understand the proposed surgical procedure and wishes to proceed.  Normal Recinos K. Ashawn Rinehart, MD, FACS [facility]  General/ Trauma Surgery  05/24/2017 5:24 PM  

## 2017-06-17 ENCOUNTER — Encounter (HOSPITAL_BASED_OUTPATIENT_CLINIC_OR_DEPARTMENT_OTHER): Payer: Self-pay | Admitting: Surgery

## 2018-10-02 ENCOUNTER — Other Ambulatory Visit: Payer: Self-pay | Admitting: Obstetrics and Gynecology

## 2018-10-02 DIAGNOSIS — Z9889 Other specified postprocedural states: Secondary | ICD-10-CM

## 2018-10-13 ENCOUNTER — Other Ambulatory Visit: Payer: Self-pay | Admitting: Obstetrics and Gynecology

## 2018-10-13 ENCOUNTER — Ambulatory Visit
Admission: RE | Admit: 2018-10-13 | Discharge: 2018-10-13 | Disposition: A | Discharge status: Home / Self Care | Payer: BC Managed Care – PPO | Source: Ambulatory Visit | Attending: Obstetrics and Gynecology | Admitting: Obstetrics and Gynecology

## 2018-10-13 DIAGNOSIS — Z1231 Encounter for screening mammogram for malignant neoplasm of breast: Secondary | ICD-10-CM

## 2018-10-13 DIAGNOSIS — Z9889 Other specified postprocedural states: Secondary | ICD-10-CM

## 2019-12-30 ENCOUNTER — Ambulatory Visit: Payer: BC Managed Care – PPO | Attending: Internal Medicine

## 2019-12-30 DIAGNOSIS — Z23 Encounter for immunization: Secondary | ICD-10-CM | POA: Insufficient documentation

## 2019-12-30 NOTE — Progress Notes (Signed)
   Covid-19 Vaccination Clinic  Name:  [name]    MRN: [id number] DOB: 1966/08/06  12/30/2019  [name] was observed post Covid-19 immunization for 15 minutes without incident. She was provided with Vaccine Information Sheet and instruction to access the V-Safe system.   [name] was instructed to call 911 with any severe reactions post vaccine: Marland Kitchen Difficulty breathing  . Swelling of face and throat  . A fast heartbeat  . A bad rash all over body  . Dizziness and weakness   Immunizations Administered    Name Date Dose VIS Date Route   Pfizer COVID-19 Vaccine 12/30/2019 11:14 AM 0.3 mL 10/05/2019 Intramuscular   Manufacturer: [employer], [employer]   Lot: [id number]   NDC: [address]-9

## 2020-01-20 ENCOUNTER — Ambulatory Visit: Payer: BC Managed Care – PPO | Attending: Internal Medicine

## 2020-01-20 DIAGNOSIS — Z23 Encounter for immunization: Secondary | ICD-10-CM

## 2020-01-20 NOTE — Progress Notes (Signed)
   Covid-19 Vaccination Clinic  Name:  [name]    MRN: [id number] DOB: 1966/08/29  01/20/2020  [name] was observed post Covid-19 immunization for 15 minutes without incident. She was provided with Vaccine Information Sheet and instruction to access the V-Safe system.   [name] was instructed to call 911 with any severe reactions post vaccine: Marland Kitchen Difficulty breathing  . Swelling of face and throat  . A fast heartbeat  . A bad rash all over body  . Dizziness and weakness   Immunizations Administered    Name Date Dose VIS Date Route   Pfizer COVID-19 Vaccine 01/20/2020 11:19 AM 0.3 mL 10/05/2019 Intramuscular   Manufacturer: [employer], [employer]   Lot: [id number]   NDC: [address]-7

## 2021-06-05 ENCOUNTER — Other Ambulatory Visit: Payer: Self-pay | Admitting: Family Medicine

## 2021-06-05 DIAGNOSIS — Z1231 Encounter for screening mammogram for malignant neoplasm of breast: Secondary | ICD-10-CM

## 2021-06-16 ENCOUNTER — Other Ambulatory Visit: Payer: Self-pay

## 2021-06-16 ENCOUNTER — Ambulatory Visit
Admission: RE | Admit: 2021-06-16 | Discharge: 2021-06-16 | Disposition: A | Discharge status: Home / Self Care | Payer: BC Managed Care – PPO | Source: Ambulatory Visit | Attending: Family Medicine | Admitting: Family Medicine

## 2021-06-16 DIAGNOSIS — Z1231 Encounter for screening mammogram for malignant neoplasm of breast: Secondary | ICD-10-CM

## 2022-08-23 ENCOUNTER — Other Ambulatory Visit: Payer: Self-pay | Admitting: Family Medicine

## 2022-08-23 DIAGNOSIS — Z1231 Encounter for screening mammogram for malignant neoplasm of breast: Secondary | ICD-10-CM

## 2022-10-15 ENCOUNTER — Encounter: Payer: Self-pay | Admitting: Radiology

## 2022-10-15 ENCOUNTER — Ambulatory Visit
Admission: RE | Admit: 2022-10-15 | Discharge: 2022-10-15 | Disposition: A | Discharge status: Home / Self Care | Payer: BC Managed Care – PPO | Source: Ambulatory Visit | Attending: Family Medicine | Admitting: Family Medicine

## 2022-10-15 DIAGNOSIS — Z1231 Encounter for screening mammogram for malignant neoplasm of breast: Secondary | ICD-10-CM

## 2022-10-22 ENCOUNTER — Other Ambulatory Visit: Payer: Self-pay | Admitting: Family Medicine

## 2022-10-22 DIAGNOSIS — R928 Other abnormal and inconclusive findings on diagnostic imaging of breast: Secondary | ICD-10-CM

## 2022-11-01 ENCOUNTER — Other Ambulatory Visit: Payer: Self-pay | Admitting: Family Medicine

## 2022-11-01 ENCOUNTER — Ambulatory Visit
Admission: RE | Admit: 2022-11-01 | Discharge: 2022-11-01 | Disposition: A | Discharge status: Home / Self Care | Payer: BC Managed Care – PPO | Source: Ambulatory Visit | Attending: Family Medicine | Admitting: Family Medicine

## 2022-11-01 ENCOUNTER — Ambulatory Visit: Payer: BC Managed Care – PPO

## 2022-11-01 DIAGNOSIS — R928 Other abnormal and inconclusive findings on diagnostic imaging of breast: Secondary | ICD-10-CM

## 2022-11-08 ENCOUNTER — Ambulatory Visit
Admission: RE | Admit: 2022-11-08 | Discharge: 2022-11-08 | Disposition: A | Discharge status: Home / Self Care | Payer: BC Managed Care – PPO | Source: Ambulatory Visit | Attending: Family Medicine | Admitting: Family Medicine

## 2022-11-08 DIAGNOSIS — R928 Other abnormal and inconclusive findings on diagnostic imaging of breast: Secondary | ICD-10-CM

## 2022-11-08 DIAGNOSIS — N6310 Unspecified lump in the right breast, unspecified quadrant: Secondary | ICD-10-CM | POA: Insufficient documentation

## 2022-11-08 HISTORY — PX: BREAST BIOPSY: SHX20

## 2022-11-11 ENCOUNTER — Other Ambulatory Visit: Payer: Self-pay | Admitting: Diagnostic Radiology

## 2022-11-19 ENCOUNTER — Ambulatory Visit: Payer: Self-pay | Admitting: Surgery

## 2022-11-19 DIAGNOSIS — N6489 Other specified disorders of breast: Secondary | ICD-10-CM

## 2022-11-20 IMAGING — MG MM DIGITAL SCREENING BILAT W/ TOMO AND CAD
8 series · 9 of 24 positions shown · non-contrast
Comparison: Previous exam(s).

CLINICAL DATA: Screening.

EXAM:
DIGITAL SCREENING BILATERAL MAMMOGRAM WITH TOMOSYNTHESIS AND CAD
TECHNIQUE: Bilateral screening digital craniocaudal and mediolateral oblique
mammograms were obtained. Bilateral screening digital breast
tomosynthesis was performed. The images were evaluated with
computer-aided detection.

[R CC synth-2D]
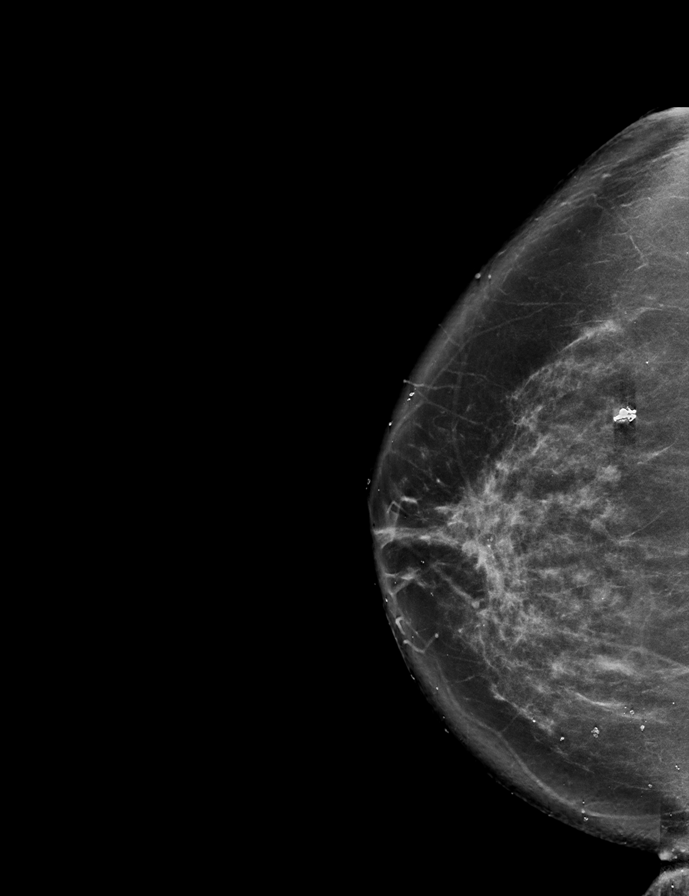

[L CC synth-2D]
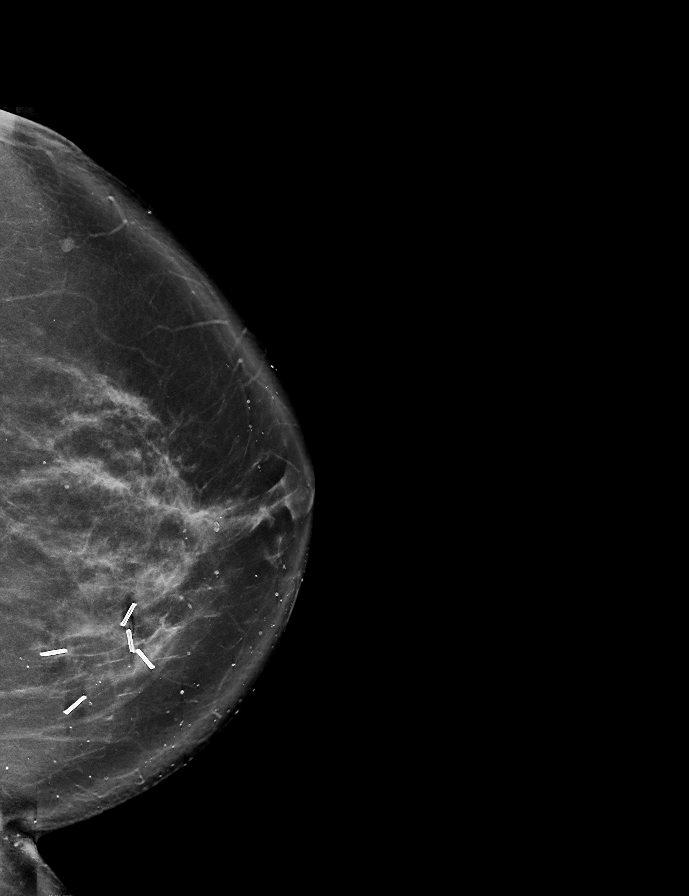

[L MLO synth-2D]
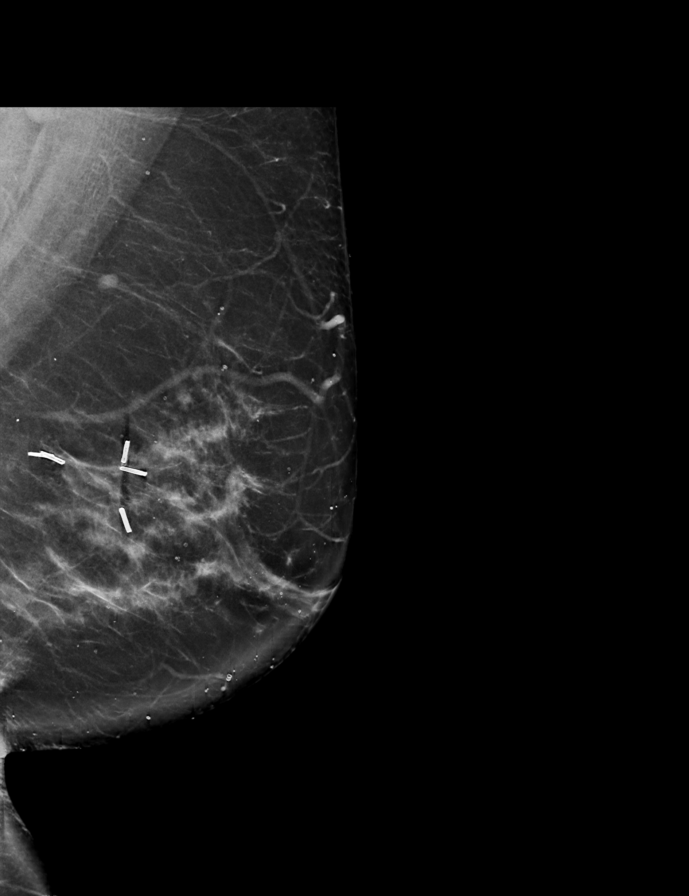

[R MLO synth-2D]
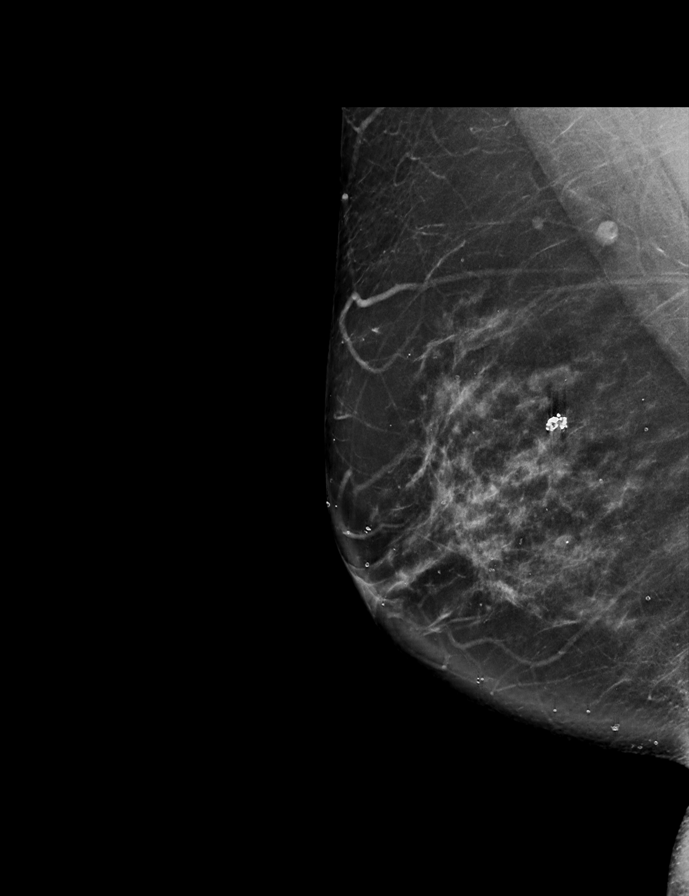

[R MLO tomo · 2 of 89 frames shown]
[frame 29/89]
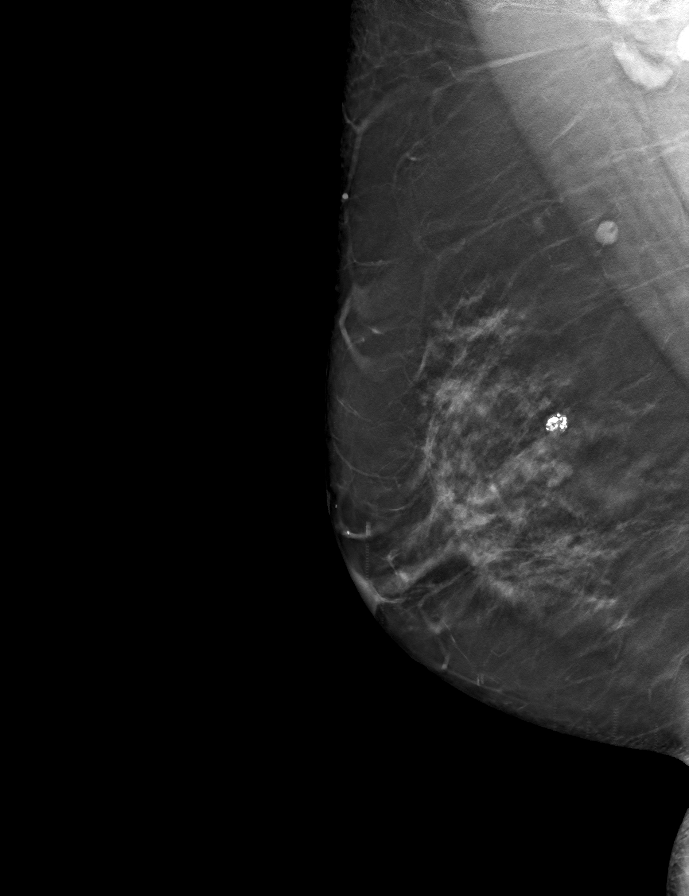
[frame 45/89]
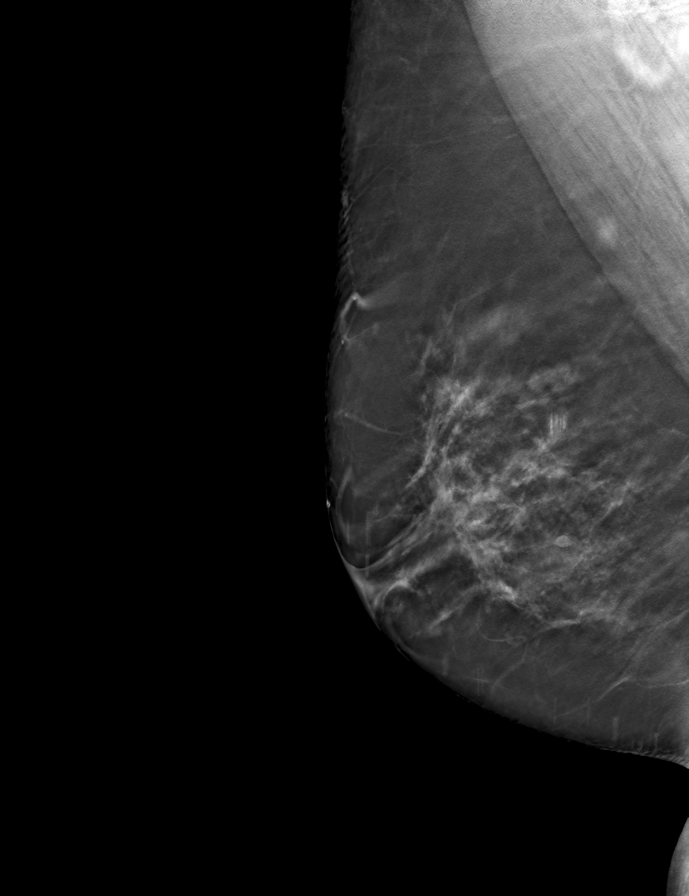

[R CC tomo · tomo slice 47/92.0]
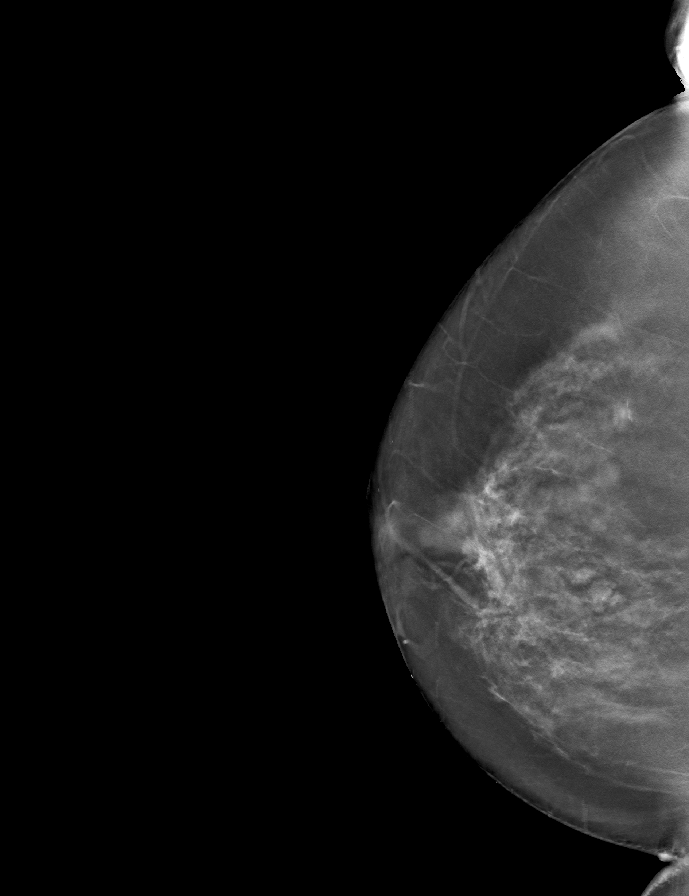

[L CC tomo · tomo slice 45/89.0]
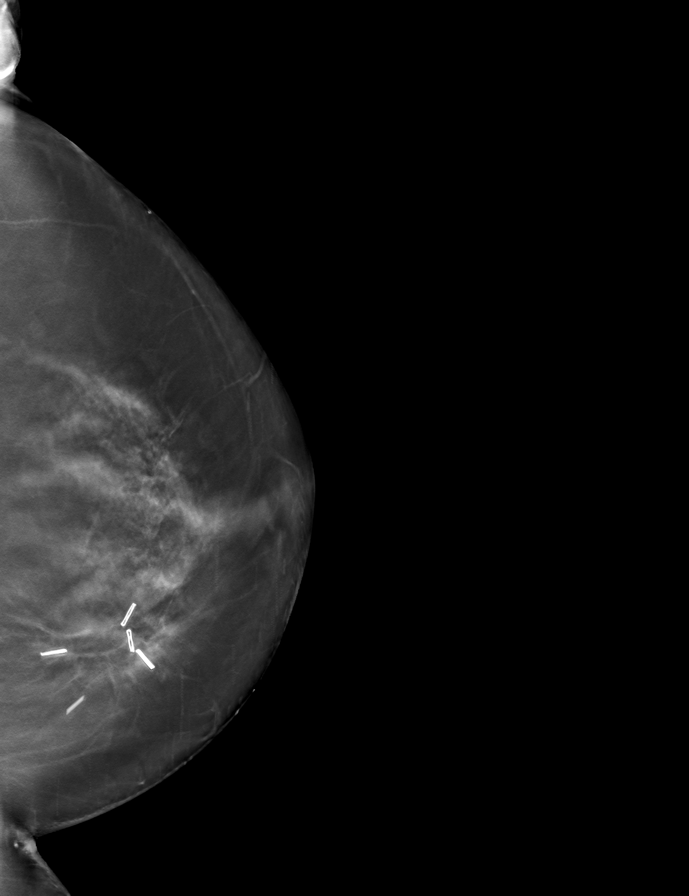

[L MLO tomo · tomo slice 45/90.0]
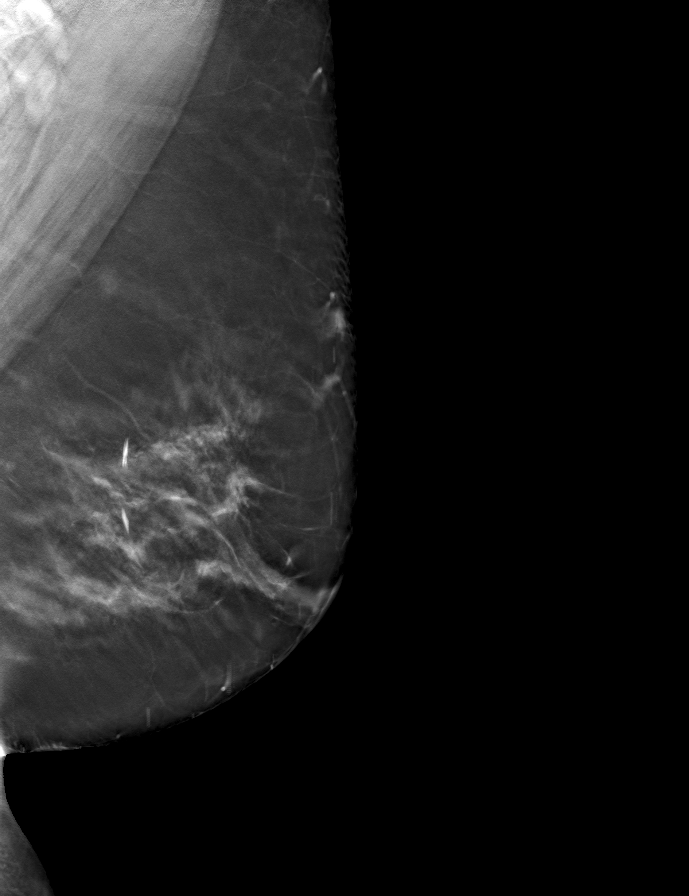

[9 of 24 positions shown; findings below may reference images not displayed]

ACR Breast Density Category c: The breast tissue is heterogeneously
dense, which may obscure small masses.
FINDINGS: There are no findings suspicious for malignancy. LEFT breast
surgical changes are again noted.
IMPRESSION: No mammographic evidence of malignancy. A result letter of this
screening mammogram will be mailed directly to the patient.

RECOMMENDATION:
Screening mammogram in one year. (Code:[id number])

BI-RADS CATEGORY  2: Benign.

## 2022-12-08 ENCOUNTER — Other Ambulatory Visit: Payer: Self-pay | Admitting: Surgery

## 2022-12-08 DIAGNOSIS — N6489 Other specified disorders of breast: Secondary | ICD-10-CM

## 2023-02-09 ENCOUNTER — Encounter (HOSPITAL_BASED_OUTPATIENT_CLINIC_OR_DEPARTMENT_OTHER): Payer: Self-pay | Admitting: Surgery

## 2023-02-09 ENCOUNTER — Other Ambulatory Visit: Payer: Self-pay

## 2023-02-14 ENCOUNTER — Ambulatory Visit
Admission: RE | Admit: 2023-02-14 | Discharge: 2023-02-14 | Disposition: A | Discharge status: Home / Self Care | Payer: BC Managed Care – PPO | Source: Ambulatory Visit | Attending: Surgery | Admitting: Surgery

## 2023-02-14 DIAGNOSIS — N6489 Other specified disorders of breast: Secondary | ICD-10-CM

## 2023-02-14 HISTORY — PX: BREAST BIOPSY: SHX20

## 2023-02-14 MED ORDER — CHLORHEXIDINE GLUCONATE CLOTH 2 % EX PADS: 6.0000 | MEDICATED_PAD | Freq: Once | CUTANEOUS | Status: DC

## 2023-02-14 NOTE — Progress Notes (Signed)

## 2023-02-15 ENCOUNTER — Ambulatory Visit (HOSPITAL_BASED_OUTPATIENT_CLINIC_OR_DEPARTMENT_OTHER)
Admission: RE | Admit: 2023-02-15 | Discharge: 2023-02-15 | Disposition: A | Discharge status: Home / Self Care | Payer: BC Managed Care – PPO | Attending: Surgery | Admitting: Surgery

## 2023-02-15 ENCOUNTER — Ambulatory Visit (HOSPITAL_BASED_OUTPATIENT_CLINIC_OR_DEPARTMENT_OTHER): Payer: BC Managed Care – PPO | Admitting: Anesthesiology

## 2023-02-15 ENCOUNTER — Other Ambulatory Visit: Payer: Self-pay

## 2023-02-15 ENCOUNTER — Ambulatory Visit
Admission: RE | Admit: 2023-02-15 | Discharge: 2023-02-15 | Disposition: A | Discharge status: Home / Self Care | Payer: BC Managed Care – PPO | Source: Ambulatory Visit | Attending: Surgery | Admitting: Surgery

## 2023-02-15 ENCOUNTER — Encounter (HOSPITAL_BASED_OUTPATIENT_CLINIC_OR_DEPARTMENT_OTHER): Payer: Self-pay | Admitting: Surgery

## 2023-02-15 ENCOUNTER — Encounter (HOSPITAL_BASED_OUTPATIENT_CLINIC_OR_DEPARTMENT_OTHER)
Admission: RE | Disposition: A | Discharge status: Home / Self Care | Payer: Self-pay | Source: Home / Self Care | Attending: Surgery

## 2023-02-15 DIAGNOSIS — N6489 Other specified disorders of breast: Secondary | ICD-10-CM

## 2023-02-15 DIAGNOSIS — N6021 Fibroadenosis of right breast: Secondary | ICD-10-CM | POA: Insufficient documentation

## 2023-02-15 DIAGNOSIS — N6011 Diffuse cystic mastopathy of right breast: Secondary | ICD-10-CM | POA: Insufficient documentation

## 2023-02-15 DIAGNOSIS — N6081 Other benign mammary dysplasias of right breast: Secondary | ICD-10-CM | POA: Diagnosis not present

## 2023-02-15 DIAGNOSIS — Z01818 Encounter for other preprocedural examination: Secondary | ICD-10-CM

## 2023-02-15 HISTORY — PX: RADIOACTIVE SEED GUIDED EXCISIONAL BREAST BIOPSY: SHX6490

## 2023-02-15 SURGERY — RADIOACTIVE SEED GUIDED BREAST BIOPSY
Anesthesia: General | Site: Breast | Laterality: Right

## 2023-02-15 MED ORDER — PHENYLEPHRINE HCL (PRESSORS) 10 MG/ML IV SOLN
INTRAVENOUS | Status: DC | PRN
  Administered 2023-02-15 (×3): 80 ug via INTRAVENOUS

## 2023-02-15 MED ORDER — FENTANYL CITRATE (PF) 100 MCG/2ML IJ SOLN
INTRAMUSCULAR | Status: DC | PRN
  Administered 2023-02-15 (×2): 50 ug via INTRAVENOUS

## 2023-02-15 MED ORDER — ONDANSETRON HCL 4 MG/2ML IJ SOLN
INTRAMUSCULAR | Status: AC
  Filled 2023-02-15: qty 2

## 2023-02-15 MED ORDER — PROPOFOL 10 MG/ML IV BOLUS
INTRAVENOUS | Status: AC
  Filled 2023-02-15: qty 20

## 2023-02-15 MED ORDER — DEXAMETHASONE SODIUM PHOSPHATE 10 MG/ML IJ SOLN
INTRAMUSCULAR | Status: AC
  Filled 2023-02-15: qty 1

## 2023-02-15 MED ORDER — CEFAZOLIN SODIUM-DEXTROSE 2-3 GM-%(50ML) IV SOLR
INTRAVENOUS | Status: DC | PRN
  Administered 2023-02-15: 2 g via INTRAVENOUS

## 2023-02-15 MED ORDER — DEXAMETHASONE SODIUM PHOSPHATE 10 MG/ML IJ SOLN
INTRAMUSCULAR | Status: DC | PRN
  Administered 2023-02-15: 5 mg via INTRAVENOUS

## 2023-02-15 MED ORDER — OXYCODONE HCL 5 MG PO TABS: 5.0000 mg | ORAL_TABLET | Freq: Once | ORAL | Status: DC | PRN

## 2023-02-15 MED ORDER — ONDANSETRON HCL 4 MG/2ML IJ SOLN: 4.0000 mg | Freq: Once | INTRAMUSCULAR | Status: DC | PRN

## 2023-02-15 MED ORDER — CEFAZOLIN SODIUM-DEXTROSE 2-4 GM/100ML-% IV SOLN
INTRAVENOUS | Status: AC
  Filled 2023-02-15: qty 100

## 2023-02-15 MED ORDER — EPHEDRINE SULFATE (PRESSORS) 50 MG/ML IJ SOLN
INTRAMUSCULAR | Status: DC | PRN
  Administered 2023-02-15: 10 mg via INTRAVENOUS

## 2023-02-15 MED ORDER — PROPOFOL 10 MG/ML IV BOLUS
INTRAVENOUS | Status: DC | PRN
  Administered 2023-02-15: 120 mg via INTRAVENOUS

## 2023-02-15 MED ORDER — MIDAZOLAM HCL 2 MG/2ML IJ SOLN
INTRAMUSCULAR | Status: AC
  Filled 2023-02-15: qty 2

## 2023-02-15 MED ORDER — BUPIVACAINE-EPINEPHRINE (PF) 0.25% -1:200000 IJ SOLN
INTRAMUSCULAR | Status: DC | PRN
  Administered 2023-02-15: 10 mL

## 2023-02-15 MED ORDER — ACETAMINOPHEN 500 MG PO TABS
ORAL_TABLET | ORAL | Status: AC
  Filled 2023-02-15: qty 2

## 2023-02-15 MED ORDER — LACTATED RINGERS IV SOLN: INTRAVENOUS | Status: DC

## 2023-02-15 MED ORDER — DIPHENHYDRAMINE HCL 50 MG/ML IJ SOLN
INTRAMUSCULAR | Status: AC
  Filled 2023-02-15: qty 1

## 2023-02-15 MED ORDER — LIDOCAINE HCL (CARDIAC) PF 100 MG/5ML IV SOSY
PREFILLED_SYRINGE | INTRAVENOUS | Status: DC | PRN
  Administered 2023-02-15: 50 mg via INTRAVENOUS

## 2023-02-15 MED ORDER — ACETAMINOPHEN 500 MG PO TABS: 1000.0000 mg | ORAL_TABLET | ORAL | Status: DC

## 2023-02-15 MED ORDER — BUPIVACAINE-EPINEPHRINE (PF) 0.25% -1:200000 IJ SOLN
INTRAMUSCULAR | Status: AC
  Filled 2023-02-15: qty 30

## 2023-02-15 MED ORDER — FENTANYL CITRATE (PF) 100 MCG/2ML IJ SOLN: 25.0000 ug | INTRAMUSCULAR | Status: DC | PRN

## 2023-02-15 MED ORDER — CEFAZOLIN SODIUM-DEXTROSE 2-4 GM/100ML-% IV SOLN: 2.0000 g | INTRAVENOUS | Status: DC

## 2023-02-15 MED ORDER — PROPOFOL 500 MG/50ML IV EMUL
INTRAVENOUS | Status: DC | PRN
  Administered 2023-02-15: 35 ug/kg/min via INTRAVENOUS

## 2023-02-15 MED ORDER — OXYCODONE HCL 5 MG/5ML PO SOLN: 5.0000 mg | Freq: Once | ORAL | Status: DC | PRN

## 2023-02-15 MED ORDER — FENTANYL CITRATE (PF) 100 MCG/2ML IJ SOLN
INTRAMUSCULAR | Status: AC
  Filled 2023-02-15: qty 2

## 2023-02-15 MED ORDER — LIDOCAINE 2% (20 MG/ML) 5 ML SYRINGE
INTRAMUSCULAR | Status: AC
  Filled 2023-02-15: qty 5

## 2023-02-15 MED ORDER — LACTATED RINGERS IV SOLN: INTRAVENOUS | Status: DC | PRN

## 2023-02-15 MED ORDER — MIDAZOLAM HCL 2 MG/2ML IJ SOLN
INTRAMUSCULAR | Status: DC | PRN
  Administered 2023-02-15: 1 mg via INTRAVENOUS

## 2023-02-15 SURGICAL SUPPLY — 47 items
APL PRP STRL LF DISP 70% ISPRP (MISCELLANEOUS) ×1
APL SKNCLS STERI-STRIP NONHPOA (GAUZE/BANDAGES/DRESSINGS) ×1
APPLIER CLIP 9.375 MED OPEN (MISCELLANEOUS)
APR CLP MED 9.3 20 MLT OPN (MISCELLANEOUS)
BENZOIN TINCTURE PRP APPL 2/3 (GAUZE/BANDAGES/DRESSINGS) ×1 IMPLANT
BLADE HEX COATED 2.75 (ELECTRODE) ×1 IMPLANT
BLADE SURG 15 STRL LF DISP TIS (BLADE) ×1 IMPLANT
BLADE SURG 15 STRL SS (BLADE) ×1
CANISTER SUCT 1200ML W/VALVE (MISCELLANEOUS) ×1 IMPLANT
CHLORAPREP W/TINT 26 (MISCELLANEOUS) ×1 IMPLANT
CLIP APPLIE 9.375 MED OPEN (MISCELLANEOUS) IMPLANT
COVER BACK TABLE 60X90IN (DRAPES) ×1 IMPLANT
COVER MAYO STAND STRL (DRAPES) ×1 IMPLANT
COVER PROBE CYLINDRICAL 5X96 (MISCELLANEOUS) ×1 IMPLANT
DRAPE LAPAROTOMY 100X72 PEDS (DRAPES) ×1 IMPLANT
DRAPE UTILITY XL STRL (DRAPES) ×1 IMPLANT
DRSG TEGADERM 4X4.75 (GAUZE/BANDAGES/DRESSINGS) ×1 IMPLANT
ELECT REM PT RETURN 9FT ADLT (ELECTROSURGICAL) ×1
ELECTRODE REM PT RTRN 9FT ADLT (ELECTROSURGICAL) ×1 IMPLANT
GAUZE SPONGE 2X2 STRL 8-PLY (GAUZE/BANDAGES/DRESSINGS) IMPLANT
GAUZE SPONGE 4X4 12PLY STRL LF (GAUZE/BANDAGES/DRESSINGS) ×1 IMPLANT
GLOVE BIO SURGEON STRL SZ7 (GLOVE) ×1 IMPLANT
GLOVE BIOGEL PI IND STRL 7.0 (GLOVE) IMPLANT
GLOVE BIOGEL PI IND STRL 7.5 (GLOVE) ×1 IMPLANT
GLOVE ECLIPSE 7.0 STRL STRAW (GLOVE) IMPLANT
GOWN STRL REUS W/ TWL LRG LVL3 (GOWN DISPOSABLE) ×2 IMPLANT
GOWN STRL REUS W/TWL LRG LVL3 (GOWN DISPOSABLE) ×2
KIT MARKER MARGIN INK (KITS) ×1 IMPLANT
NDL HYPO 25X1 1.5 SAFETY (NEEDLE) ×1 IMPLANT
NEEDLE HYPO 25X1 1.5 SAFETY (NEEDLE) ×1 IMPLANT
NS IRRIG 1000ML POUR BTL (IV SOLUTION) ×1 IMPLANT
PACK BASIN DAY SURGERY FS (CUSTOM PROCEDURE TRAY) ×1 IMPLANT
PENCIL SMOKE EVACUATOR (MISCELLANEOUS) ×1 IMPLANT
SLEEVE SCD COMPRESS KNEE MED (STOCKING) ×1 IMPLANT
SPIKE FLUID TRANSFER (MISCELLANEOUS) IMPLANT
SPONGE T-LAP 18X18 ~~LOC~~+RFID (SPONGE) IMPLANT
SPONGE T-LAP 4X18 ~~LOC~~+RFID (SPONGE) ×1 IMPLANT
STRIP CLOSURE SKIN 1/2X4 (GAUZE/BANDAGES/DRESSINGS) ×1 IMPLANT
SUT MON AB 4-0 PC3 18 (SUTURE) ×1 IMPLANT
SUT SILK 2 0 SH (SUTURE) IMPLANT
SUT VIC AB 3-0 SH 27 (SUTURE) ×1
SUT VIC AB 3-0 SH 27X BRD (SUTURE) ×1 IMPLANT
SYR CONTROL 10ML LL (SYRINGE) ×1 IMPLANT
TOWEL GREEN STERILE FF (TOWEL DISPOSABLE) ×1 IMPLANT
TRAY FAXITRON CT DISP (TRAY / TRAY PROCEDURE) ×1 IMPLANT
TUBE CONNECTING 20X1/4 (TUBING) ×1 IMPLANT
YANKAUER SUCT BULB TIP NO VENT (SUCTIONS) ×1 IMPLANT

## 2023-02-15 NOTE — H&P (Signed)
Subjective    Chief Complaint: New Consultation (R breast lesion)       History of Present Illness: [name] is a 57 y.o. female who is seen today as an office consultation at the request of Dr. Lenise Arena for evaluation of New Consultation (R breast lesion) .     This is a 57 year old female who is a former patient of mine.  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  The final pathology showed no residual atypia.  Recently, she underwent routine screening mammogram.  This revealed some distortion in the right retroareolar space.  At 12:00 in the right breast 1 cm from the nipple, ultrasound showed a 1.8 x 2.7 x 0.6 cm irregular hypoechoic mass.  More centrally she had a another mass at 12:00 located 2 cm from the nipple measuring 1.7 x 0.8 x 1.1 cm.  The axilla was negative.  Both of these masses were biopsied.  Both showed complex sclerosing lesion with no atypia or malignancy.  The patient had significant bruising and hematoma after her biopsy.     Review of Systems: A complete review of systems was obtained from the patient.  I have reviewed this information and discussed as appropriate with the patient.  See HPI as well for other ROS.   Review of Systems  Constitutional: Negative.   HENT: Negative.    Eyes: Negative.   Respiratory: Negative.    Cardiovascular: Negative.   Gastrointestinal: Negative.   Genitourinary: Negative.   Musculoskeletal: Negative.   Skin: Negative.   Neurological: Negative.   Endo/Heme/Allergies: Negative.   Psychiatric/Behavioral: Negative.          Medical History: Past Medical History      Past Medical History:  Diagnosis Date   Arthritis          Problem List     Patient Active Problem List  Diagnosis   Complex sclerosing lesion of right breast        Past Surgical History       Past Surgical History:  Procedure Laterality Date   MASTECTOMY PARTIAL / LUMPECTOMY             Allergies  No Known Allergies     Medications Ordered Prior to Encounter        Current Outpatient Medications on File Prior to Visit  Medication Sig Dispense Refill   multivitamin with minerals tablet Take 1 tablet by mouth        No current facility-administered medications on file prior to visit.        Family History       Family History  Problem Relation Age of Onset   Diabetes Mother     High blood pressure (Hypertension) Father          Tobacco Use History  Social History       Tobacco Use  Smoking Status Never  Smokeless Tobacco Never        Social History  Social History        Socioeconomic History   Marital status: Married  Tobacco Use   Smoking status: Never   Smokeless tobacco: Never  Vaping Use   Vaping Use: Never used  Substance and Sexual Activity   Alcohol use: Yes   Drug use: Never        Objective:          Vitals:    11/19/22 0918 11/19/22 0920  BP: (!) 142/92  Pulse: (!) 141    Temp: 37.1 C (98.7 F)    SpO2: 97%    Weight: 67 kg (147 lb 12.8 oz)    Height: 149.9 cm ( )    PainSc:   0-No pain    Body mass index is 29.85 kg/m.   Physical Exam    Constitutional:  WDWN in NAD, conversant, no obvious deformities; lying in bed comfortably Eyes:  Pupils equal, round; sclera anicteric; moist conjunctiva; no lid lag HENT:  Oral mucosa moist; good dentition  Neck:  No masses palpated, trachea midline; no thyromegaly Lungs:  CTA bilaterally; normal respiratory effort Breasts:  symmetric, well-healed left circumareolar incision.  No palpable left breast masses or axillary lymphadenopathy.  The right breast shows significant central ecchymosis with palpable hematoma in the retroareolar space.  No other palpable masses.  No axillary lymphadenopathy CV:  Regular rate and rhythm; no murmurs; extremities well-perfused with no edema Abd:  +bowel sounds, soft, non-tender, no palpable organomegaly; no palpable  hernias Musc: Normal gait; no apparent clubbing or cyanosis in extremities Lymphatic:  No palpable cervical or axillary lymphadenopathy Skin:  Warm, dry; no sign of jaundice Psychiatric - alert and oriented x 4; calm mood and affect     Labs, Imaging and Diagnostic Testing: Diagnosis 1. Breast, right, needle core biopsy, 12 o'clock, 1 cmfn, ribbon clip COMPLEX SCLEROSING LESION FIBROCYSTIC CHANGES INCLUDING STROMAL FIBROSIS, ADENOSIS, SCLEROSING ADENOSIS AND USUAL DUCT HYPERPLASIA NEGATIVE FOR MICROCALCIFICATIONS NEGATIVE FOR ATYPIA AND CARCINOMA 2. Breast, right, needle core biopsy, 12 o'clock, 2 cmfn, coil clip COMPLEX SCLEROSING LESION FIBROCYSTIC CHANGES INCLUDING STROMAL FIBROSIS, ADENOSIS AND USUAL DUCT HYPERPLASIA MICROCALCIFICATIONS PRESENT NEGATIVE FOR ATYPIA AND CARCINOMA Diagnosis Note 1. -2. Diagnoses called to Darl Pikes at [facility] by Dr. Venetia Night on 11/09/2022 at 9:32 AM. Jerene Bears MD Pathologist, Electronic Signature (Case signed 11/09/2022) Specimen   CLINICAL DATA:  Patient recalled from screening for right breast distortion and left breast asymmetry.   EXAM: DIGITAL DIAGNOSTIC BILATERAL MAMMOGRAM WITH TOMOSYNTHESIS; ULTRASOUND RIGHT BREAST LIMITED   TECHNIQUE: Bilateral digital diagnostic mammography and breast tomosynthesis was performed.; Targeted ultrasound examination of the right breast was performed   COMPARISON:  Previous exam(s).   ACR Breast Density Category c: The breast tissue is heterogeneously dense, which may obscure small masses.   FINDINGS: There is persistent moderate-sized area of distortion within the retroareolar right breast demonstrated both on the spot compression cc, spot compression MLO and full paddle true lateral views. Additionally, this area persists on the rolled cc views.   Questioned asymmetry within the outer left breast appeared to resolve with additional imaging suggestive of  dense fibroglandular tissue.   Targeted ultrasound is performed, showing a 1.8 x 2.7 x 0.6 cm irregular hypoechoic mass within the right breast 12 o'clock position 1 cm from the nipple. This extends more centrally into the breast where it measured 1.7 x 0.8 x 1.1 cm at the 12 o'clock position 2 cm from the nipple. No right axillary adenopathy.   IMPRESSION: Irregular hypoechoic mass extending from the retroareolar right breast more centrally into the right breast at the 12 o'clock position.   RECOMMENDATION: Ultrasound-guided core needle biopsy (2 sites) of the retroareolar right breast mass. Recommend biopsy of the immediate retroareolar portion of the mass and biopsy of the more centrally located portion of the mass approximately 2 cm from the nipple. Recommend correlation with post clip film mammography to ensure this encompasses the focal area of distortion on mammography.  I have discussed the findings and recommendations with the patient. If applicable, a reminder letter will be sent to the patient regarding the next appointment.   BI-RADS CATEGORY  4: Suspicious.     Electronically Signed   By: Annia Belt M.D.   On: 11/01/2022 09:31   Assessment and Plan:  Diagnoses and all orders for this visit:   Complex sclerosing lesion of right breast    The patient has 2 adjacent complex sclerosing lesions in the right breast.   Recommend right breast radioactive seed localized excisional biopsy x 2.  The patient had a very good experience with her left radioactive seed localized excisional biopsy in 2019.  The surgical procedure has been discussed with the patient.  Potential risks, benefits, alternative treatments, and expected outcomes have been explained.  All of the patient's questions at this time have been answered.  The likelihood of reaching the patient's treatment goal is good.  The patient understand the proposed surgical procedure and wishes to proceed.  Wilmon Arms.  Corliss Skains, MD, [facility]  General Surgery   02/15/2023 7:16 AM

## 2023-02-15 NOTE — Transfer of Care (Signed)
Immediate Anesthesia Transfer of Care Note  Patient: [name]  Procedure(s) Performed: RADIOACTIVE SEED GUIDED EXCISIONAL RIGHT BREAST BIOPSY X2 (Right: Breast)  Patient Location: PACU  Anesthesia Type:General  Level of Consciousness: awake and patient cooperative  Airway & Oxygen Therapy: Patient Spontanous Breathing and Patient connected to face mask oxygen  Post-op Assessment: Report given to RN and Post -op Vital signs reviewed and stable  Post vital signs: Reviewed and stable  Last Vitals:  Vitals Value Taken Time  BP 122/74 02/15/23 0838  Temp 36.3 C 02/15/23 0838  Pulse 117 02/15/23 0838  Resp 21 02/15/23 0838  SpO2 97 % 02/15/23 0838  Vitals shown include unvalidated device data.  Last Pain:  Vitals:   02/15/23 0623  TempSrc: Oral  PainSc: 0-No pain         Complications: No notable events documented.

## 2023-02-15 NOTE — Anesthesia Procedure Notes (Signed)
Procedure Name: LMA Insertion Date/Time: 02/15/2023 7:36 AM  Performed by: Karen Kitchens, CRNAPre-anesthesia Checklist: Patient identified, Emergency Drugs available, Suction available and Patient being monitored Patient Re-evaluated:Patient Re-evaluated prior to induction Oxygen Delivery Method: Circle system utilized Preoxygenation: Pre-oxygenation with 100% oxygen Induction Type: IV induction Ventilation: Mask ventilation without difficulty LMA: LMA inserted LMA Size: 3.0 Number of attempts: 1 Airway Equipment and Method: Bite block Placement Confirmation: positive ETCO2, CO2 detector and breath sounds checked- equal and bilateral Tube secured with: Tape Dental Injury: Teeth and Oropharynx as per pre-operative assessment

## 2023-02-15 NOTE — Anesthesia Preprocedure Evaluation (Signed)
Anesthesia Evaluation  Patient identified by MRN, date of birth, ID band Patient awake    Reviewed: Allergy & Precautions, NPO status , Patient's Chart, lab work & pertinent test results  Airway Mallampati: II  TM Distance: >3 FB     Dental no notable dental hx. (+) Teeth Intact, Caps, Dental Advisory Given   Pulmonary neg pulmonary ROS   Pulmonary exam normal breath sounds clear to auscultation       Cardiovascular negative cardio ROS Normal cardiovascular exam Rhythm:Regular Rate:Normal     Neuro/Psych negative neurological ROS  negative psych ROS   GI/Hepatic negative GI ROS, Neg liver ROS,,,  Endo/Other  Right breast mass x 2  Renal/GU negative Renal ROS  negative genitourinary   Musculoskeletal   Abdominal   Peds  Hematology negative hematology ROS (+)   Anesthesia Other Findings   Reproductive/Obstetrics Hx/o infertility Hx/o endometriosis                             Anesthesia Physical Anesthesia Plan  ASA: 2  Anesthesia Plan: General   Post-op Pain Management: Minimal or no pain anticipated   Induction: Intravenous  PONV Risk Score and Plan: 4 or greater and Treatment may vary due to age or medical condition, Midazolam and Ondansetron  Airway Management Planned: LMA  Additional Equipment: None  Intra-op Plan:   Post-operative Plan: Extubation in OR  Informed Consent: I have reviewed the patients History and Physical, chart, labs and discussed the procedure including the risks, benefits and alternatives for the proposed anesthesia with the patient or authorized representative who has indicated his/her understanding and acceptance.     Dental advisory given  Plan Discussed with: CRNA and Anesthesiologist  Anesthesia Plan Comments:        Anesthesia Quick Evaluation

## 2023-02-15 NOTE — Anesthesia Postprocedure Evaluation (Signed)
Anesthesia Post Note  Patient: [name]  Procedure(s) Performed: RADIOACTIVE SEED GUIDED EXCISIONAL RIGHT BREAST BIOPSY X2 (Right: Breast)     Patient location during evaluation: PACU Anesthesia Type: General Level of consciousness: awake and alert and oriented Pain management: pain level controlled Vital Signs Assessment: post-procedure vital signs reviewed and stable Respiratory status: spontaneous breathing, nonlabored ventilation and respiratory function stable Cardiovascular status: blood pressure returned to baseline and stable Postop Assessment: no apparent nausea or vomiting Anesthetic complications: no   No notable events documented.  Last Vitals:  Vitals:   02/15/23 0859 02/15/23 0915  BP: 127/65 129/69  Pulse: (!) 107 (!) 105  Resp: 17 20  Temp:  36.6 C  SpO2: 98% 97%    Last Pain:  Vitals:   02/15/23 0915  TempSrc:   PainSc: 0-No pain                 Branton Einstein A.

## 2023-02-15 NOTE — Discharge Instructions (Addendum)
[facility] Office Phone Number [phone]  BREAST BIOPSY/ PARTIAL MASTECTOMY: POST OP INSTRUCTIONS  Always review your discharge instruction sheet given to you by the facility where your surgery was performed.  IF YOU HAVE DISABILITY OR FAMILY LEAVE FORMS, YOU MUST BRING THEM TO THE OFFICE FOR PROCESSING.  DO NOT GIVE THEM TO YOUR DOCTOR.  A prescription for pain medication may be given to you upon discharge.  Take your pain medication as prescribed, if needed.  If narcotic pain medicine is not needed, then you may take acetaminophen (Tylenol) or ibuprofen (Advil) as needed. Take your usually prescribed medications unless otherwise directed If you need a refill on your pain medication, please contact your pharmacy.  They will contact our office to request authorization.  Prescriptions will not be filled after 5pm or on week-ends. You should eat very light the first 24 hours after surgery, such as soup, crackers, pudding, etc.  Resume your normal diet the day after surgery. Most patients will experience some swelling and bruising in the breast.  Ice packs and a good support bra will help.  Swelling and bruising can take several days to resolve.  It is common to experience some constipation if taking pain medication after surgery.  Increasing fluid intake and taking a stool softener will usually help or prevent this problem from occurring.  A mild laxative (Milk of Magnesia or Miralax) should be taken according to package directions if there are no bowel movements after 48 hours. Unless discharge instructions indicate otherwise, you may remove your bandages 24-48 hours after surgery, and you may shower at that time.  You may have steri-strips (small skin tapes) in place directly over the incision.  These strips should be left on the skin for 7-10 days.  If your surgeon used skin glue on the incision, you may shower in 24 hours.  The glue will flake off over the next 2-3 weeks.  Any  sutures or staples will be removed at the office during your follow-up visit. ACTIVITIES:  You may resume regular daily activities (gradually increasing) beginning the next day.  Wearing a good support bra or sports bra minimizes pain and swelling.  You may have sexual intercourse when it is comfortable. You may drive when you no longer are taking prescription pain medication, you can comfortably wear a seatbelt, and you can safely maneuver your car and apply brakes. RETURN TO WORK:  ______________________________________________________________________________________ You should see your doctor in the office for a follow-up appointment approximately two weeks after your surgery.  Your doctor's nurse will typically make your follow-up appointment when she calls you with your pathology report.  Expect your pathology report 2-3 business days after your surgery.  You may call to check if you do not hear from us after three days. OTHER INSTRUCTIONS: _______________________________________________________________________________________________ _____________________________________________________________________________________________________________________________________ _____________________________________________________________________________________________________________________________________ _____________________________________________________________________________________________________________________________________  WHEN TO CALL YOUR DOCTOR: Fever over 101.0 Nausea and/or vomiting. Extreme swelling or bruising. Continued bleeding from incision. Increased pain, redness, or drainage from the incision.  The clinic staff is available to answer your questions during regular business hours.  Please don't hesitate to call and ask to speak to one of the nurses for clinical concerns.  If you have a medical emergency, go to the nearest emergency room or call 911.  A surgeon from [facility] is always on call at the hospital.  For further questions, please visit [email or web address]    Post Anesthesia Home Care Instructions  Activity: Get plenty of rest for the remainder of   the day. A responsible individual must stay with you for 24 hours following the procedure.  For the next 24 hours, DO NOT: -Drive a car -Operate machinery -Drink alcoholic beverages -Take any medication unless instructed by your physician -Make any legal decisions or sign important papers.  Meals: Start with liquid foods such as gelatin or soup. Progress to regular foods as tolerated. Avoid greasy, spicy, heavy foods. If nausea and/or vomiting occur, drink only clear liquids until the nausea and/or vomiting subsides. Call your physician if vomiting continues.  Special Instructions/Symptoms: Your throat may feel dry or sore from the anesthesia or the breathing tube placed in your throat during surgery. If this causes discomfort, gargle with warm salt water. The discomfort should disappear within 24 hours.  If you had a scopolamine patch placed behind your ear for the management of post- operative nausea and/or vomiting:  1. The medication in the patch is effective for 72 hours, after which it should be removed.  Wrap patch in a tissue and discard in the trash. Wash hands thoroughly with soap and water. 2. You may remove the patch earlier than 72 hours if you experience unpleasant side effects which may include dry mouth, dizziness or visual disturbances. 3. Avoid touching the patch. Wash your hands with soap and water after contact with the patch.      

## 2023-02-15 NOTE — Op Note (Signed)
Pre-op diagnosis: Complex sclerosing lesion x 2 right breast Postop diagnosis: Same Procedure performed: Right breast radioactive seed localized excisional biopsy x 2 Surgeon:Kionna Brier K Bedford Winsor Anesthesia: General via LMA Indications:This is a 57 year old female who is a former patient of mine. 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 The final pathology showed no residual atypia. Recently, she underwent routine screening mammogram. This revealed some distortion in the right retroareolar space. At 12:00 in the right breast 1 cm from the nipple, ultrasound showed a 1.8 x 2.7 x 0.6 cm irregular hypoechoic mass. More centrally she had a another mass at 12:00 located 2 cm from the nipple measuring 1.7 x 0.8 x 1.1 cm. The axilla was negative. Both of these masses were biopsied. Both showed complex sclerosing lesion with no atypia or malignancy.   2 radioactive seeds were placed yesterday by radiology.  Description of procedure: The patient is brought to the operating room placed in supine position on the operating room table. After an adequate level of general anesthesia was obtained, her right breast was prepped with ChloraPrep and draped in sterile fashion. A timeout was taken to ensure the proper patient and proper procedure. We interrogated the breast with the neoprobe.  Both radioactive seeds are identified above the nipple just outside the areola.  We made a circumareolar incision around the upper side of the nipple after infiltrating with 0.25% Marcaine. Dissection was carried down in the breast tissue with cautery. We used the neoprobe to guide [place] towards the more superior radioactive seed.  We raised our margins around the superior seed.  We then dissected around the more inferiorly placed seed.  We excised a specimen that contained both radioactive seeds.  The specimen was removed and was oriented with a paint kit. Specimen mammogram  showed both radioactive seeds as well as the coil biopsy clip within the specimen.  The ribbon shaped biopsy clip was not seen in the specimen.  We excised an additional inferior margin and an additional superior margin.  These were both oriented with the paint kit as well.  Specimen mammograms did not show the biopsy clip in the specimens.  I chose not to continue excising margins.  All 3 specimens were sent for pathologic examination. There is no residual radioactivity within the biopsy cavity. We inspected carefully for hemostasis. The wound was thoroughly irrigated. The wound was closed with a deep layer of 3-0 Vicryl and a subcuticular layer of 4-0 Monocryl. Benzoin Steri-Strips were applied. The patient was then extubated and brought to the recovery room in stable condition. All sponge, instrument, and needle counts are correct.  Wilmon Arms. Corliss Skains, MD, [facility]  General/ Trauma Surgery  02/15/2023 8:33 AM

## 2023-02-16 ENCOUNTER — Encounter (HOSPITAL_BASED_OUTPATIENT_CLINIC_OR_DEPARTMENT_OTHER): Payer: Self-pay | Admitting: Surgery

## 2023-02-16 LAB — SURGICAL PATHOLOGY
